# Patient Record
Sex: Male | Born: 1960 | Race: White | Hispanic: No | Marital: Married | Smoking: Former smoker
Health system: Southern US, Community
[De-identification: ages and names within clinical notes are randomized; demographics above are authoritative.]

## PROBLEM LIST (undated history)

## (undated) DIAGNOSIS — S99911A Unspecified injury of right ankle, initial encounter: Secondary | ICD-10-CM

## (undated) DIAGNOSIS — I10 Essential (primary) hypertension: Secondary | ICD-10-CM

## (undated) DIAGNOSIS — K219 Gastro-esophageal reflux disease without esophagitis: Secondary | ICD-10-CM

## (undated) DIAGNOSIS — M5020 Other cervical disc displacement, unspecified cervical region: Secondary | ICD-10-CM

## (undated) DIAGNOSIS — K635 Polyp of colon: Secondary | ICD-10-CM

## (undated) DIAGNOSIS — J302 Other seasonal allergic rhinitis: Secondary | ICD-10-CM

## (undated) DIAGNOSIS — N200 Calculus of kidney: Secondary | ICD-10-CM

## (undated) DIAGNOSIS — M549 Dorsalgia, unspecified: Secondary | ICD-10-CM

## (undated) HISTORY — DX: Dorsalgia, unspecified: M54.9

## (undated) HISTORY — DX: Other cervical disc displacement, unspecified cervical region: M50.20

## (undated) HISTORY — DX: Calculus of kidney: N20.0

## (undated) HISTORY — DX: Gastro-esophageal reflux disease without esophagitis: K21.9

## (undated) HISTORY — PX: CERVICAL FUSION: SHX112

## (undated) HISTORY — DX: Essential (primary) hypertension: I10

---

## 2004-08-07 ENCOUNTER — Ambulatory Visit: Payer: Self-pay | Admitting: Internal Medicine

## 2004-08-20 ENCOUNTER — Ambulatory Visit (HOSPITAL_COMMUNITY): Admission: RE | Admit: 2004-08-20 | Discharge: 2004-08-20 | Payer: Self-pay | Admitting: Internal Medicine

## 2004-08-20 ENCOUNTER — Ambulatory Visit: Payer: Self-pay | Admitting: Internal Medicine

## 2004-08-20 HISTORY — PX: COLONOSCOPY: SHX174

## 2007-01-29 ENCOUNTER — Ambulatory Visit (HOSPITAL_COMMUNITY): Admission: RE | Admit: 2007-01-29 | Discharge: 2007-01-29 | Payer: Self-pay | Admitting: Internal Medicine

## 2007-03-10 ENCOUNTER — Observation Stay (HOSPITAL_COMMUNITY): Admission: RE | Admit: 2007-03-10 | Discharge: 2007-03-11 | Payer: Self-pay | Admitting: Neurosurgery

## 2010-07-23 NOTE — Op Note (Signed)
Marcus Frazier, Marcus Frazier NO.:  [id number]   MEDICAL RECORD NO.:  [id number]          PATIENT TYPE:  INP   LOCATION:  3027                         FACILITY:  [facility]   PHYSICIAN:  Cristi Loron, M.D.DATE OF BIRTH:  04-Sep-1960   DATE OF PROCEDURE:  03/10/2007  DATE OF DISCHARGE:                               OPERATIVE REPORT   BRIEF HISTORY:  The patient is a 50 year old white male who suffered  from neck and right arm pain.  He failed medical management, was worked  up with a cervical MRI which demonstrated a large herniated disc at C5-  C6 on the right, as well as significant spondylosis at C4-C5  bilaterally.  I discussed the various treatment option with the patient  including surgery.  The patient has weighed the risks, benefits, and  alternatives of the surgery and decided to proceed with a C4-C5 and C5-  C6 anterior cervical discectomy fusion and plating.   PREOPERATIVE DIAGNOSES:  C4-C5 and C5-C6 herniated nucleus pulposus,  spondylosis, cervical radiculopathy, myelopathy, and cervicalgia.   POSTOPERATIVE DIAGNOSES:  C4-C5 and C5-C6 herniated nucleus pulposus,  spondylosis, cervical radiculopathy, myelopathy, and cervicalgia.   PROCEDURE:  C4-C5 and C5-C6 extensive anterior cervical  discectomy/decompression; C4-C5 and C5-C6 anterior interbody arthrodesis  with local autograft bone and Actifuse bone graft extender; insertion of  C4-C5 and C5-C6 interbody prosthesis (Alphatec PEEK interbody  prosthesis); C4-C6 anterior cervical plating with Codman Slim-Loc  titanium plate and screws.   SURGEON:  Cristi Loron, MD   ASSISTANT:  Coletta Memos, MD   ANESTHESIA:  General endotracheal.   ESTIMATED BLOOD LOSS:  100 mL.   SPECIMENS:  None.   DRAINS:  None.   COMPLICATIONS:  None.   DESCRIPTION OF PROCEDURE:  The patient was brought to the operating room  by the anesthesia team.  General endotracheal anesthesia was induced.  The patient remained  in supine position.  A roll was placed under the  shoulders to place the neck in a slight extension.  His anterior  cervical region was then prepared with Betadine scrub and Betadine  solution.  Sterile drapes were applied.  I then injected the area to be  incised with Marcaine with epinephrine solution.  I used a scalpel to  make a transverse incision on the patient's left anterior neck.  I used  the Metzenbaum scissors to divide the platysma muscle and then to  dissect medial to the sternocleidomastoid muscle, jugular vein, and  carotid artery.  I carefully dissected down towards the anterior  cervical spine identifying the esophagus and retracted medially.  We  then used Kitner swabs to clear the soft tissue from the anterior  cervical spine and then inserted a bent spinal needle into the upper  exposed intervertebral disc space.  We obtained intraoperative  radiograph to confirm our location.   We then used electrocautery to detach the medial border of the longus  colli muscle bilaterally at C4-C5 and C5-C6 intervertebral disc space.  We inserted a Caspar self-retaining retractor underneath the longus  colli muscle bilaterally to provide exposure.  We began at C5-C6;  we  incised the C5-C6 intervertebral disc and performed a partial  intervertebral discectomy using the pituitary forceps and the Carlen  curettes.  We then inserted distraction screws at C5 and C6 to distract  the interspace.  We then brought the operative microscope into the field  and under its magnification and illumination, we completed the  microdissection/decompression.  We used a high-speed drill to  decorticate the vertebral endplates at C5-C6 to drill away the remainder  of C5-C6 intervertebral disc to drill away some posterior spondylosis  and to thin out the posterior longitudinal ligament.  We then incised  the ligament with arachnoid knife and then removed it with the Kerrison  punch undercutting the  vertebral endplates at C5-C6 and decompressing  the thecal sac and then performed foraminotomy about the bilateral C6  nerve roots.  Of note as expected, we encountered a large herniated disc  at the right C6 nerve root.   Having completed the decompression at C5-C6, we now repeated the  procedure in an analogous fashion at C4-C5; at this level we found  spondylosis and bilateral neural foraminal stenosis.  We decompressed  the thecal sac and bilateral C5 nerve roots.   Having completed the decompression at C4-C5 and C5-C6, we now turned our  attention to arthrodesis.  We used trial spacers and determined to use a  5-mm medium PEEK interbody prosthesis.  We prefilled this prosthesis  with Actifuse bone graft substitute as well as local autograft bone we  obtained during the decompression and then inserted the prosthesis at  the distracted C5-C6 and C4-C5 interspaces.  We removed the distraction  screws, and the prosthesis had a good snug fit at both levels.   Having completed the insertion of the prosthesis and interbody fusion,  we now turned attention to anterior spinal instrumentation.  We used a  high-speed drill to remove some ventral spondylosis from the vertebral  endplates at C4-C5 and C5-C6 so that the plate would lay down flat.  We  selected the appropriate length of Codman Slim-Loc anterior cervical  plate and laid it along the anterior aspect of the vertebral bodies from  C4 to C6.  We then drilled two 14-mm holes at C4, C5, and C6 and then  secured the plate to the vertebral bodies by placing two 14-mm self-  tapping screws at C4, C5, and C6.  We obtained intraoperative  radiograph; it demonstrated good position of the plate, screws, and  bioprosthesis.  We therefore secured the screws and plate by locking  each cam.   We then obtained hemostasis using bipolar electrocautery.  We irrigated  the wound out with bacitracin solution.  We then removed the retractor  and  inspected the esophagus for any damage; there was none apparent.  We  then reapproximated the patient's platysma muscle with interrupted 3-0  Vicryl suture, the subcutaneous tissue with interrupted 3-0 Vicryl  suture, and the skin with Steri-Strips and Benzoin.  The wound was then  coated with bacitracin ointment and a sterile dressing was applied.  The  drapes were removed.  The patient was subsequently extubated by the  anesthesia team and transported to the post-anesthesia care unit in a  stable condition.  All sponge, instrument, and needle counts were  correct at the end of this case.      Cristi Loron, M.D.  Electronically Signed     JDJ/MEDQ  D:  03/10/2007  T:  03/11/2007  Job:  [id number]

## 2010-07-26 NOTE — Consult Note (Signed)
NAME:  [name], [name]               ACCOUNT NO.:  [id number]   MEDICAL RECORD NO.:  [id number]         PATIENT TYPE:  AMB   LOCATION:                                FACILITY:  [facility]   PHYSICIAN:  R. Roetta Sessions, M.D. DATE OF BIRTH:  09/05/1960   DATE OF CONSULTATION:  DATE OF DISCHARGE:                                   CONSULTATION   REASON FOR CONSULTATION:  Episode of left upper quadrant abdominal pain.   HISTORY OF PRESENT ILLNESS:  Mr. Marcus Frazier is a pleasant 50 year old  Caucasian man who comes from Dr. Yetta Numbers to further evaluate a recent  several _____________ history of left lower quadrant abdominal pain.  Symptoms started insidiously, not associated with bowel movement, no  associated constipation, diarrhea, hematochezia, or melena.  No fever,  chills, no upper GI tract symptoms/odynophagia, reflux symptoms, nausea,  vomiting, no change in weight.  [facility] scan performed at [facility] demonstrated no abnormalities of the abdomen or pelvis.  He was  given some Cipro empirically.  He told me he took one dose of Cipro and his  symptoms resolved.  He has not had any hematuria or increased urinary  frequency, no flank pain.  He has never had similar symptoms previously.  There is no family history of colorectal neoplasia.  He does note  intermittent anorectal burning and itching from time to time, and he has  seen some blood on the toilet paper on wiping.   PAST MEDICAL HISTORY:  Hypertension.   PAST SURGICAL HISTORY:  He denies any prior surgeries.   CURRENT MEDICATIONS:  1.  Diovan/HCTZ 160/12.5 mg daily.  2.  Tylenol.  3.  Actos p.r.n.   ALLERGIES:  No known drug allergies.   FAMILY HISTORY:  Father died in an accident.  Mother is alive at age 77.  No  history of chronic GI or liver illness.   SOCIAL HISTORY:  The patient is married, has two girls, age 35 and 42.  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  No  tobacco.  Rarely consumes alcohol.   REVIEW OF SYSTEMS:  As in history of present illness.   PHYSICAL EXAMINATION:  GENERAL:  A pleasant 50 year old gentleman resting  comfortably.  VITAL SIGNS:  Weight 233, height 5 feet 10 inches, temperature 97.8, blood  pressure 118/70, pulse 62.  SKIN:  Warm and dry.  HEENT:  No scleral icterus.  NECK:  JVD is not prominent.  LUNGS:  Clear to auscultation.  CARDIAC:  Regular rate and rhythm without murmurs, rubs, or gallops.  ABDOMEN:  Nondistended, positive bowel sounds, soft, nontender without  appreciable mass or organomegaly.  EXTREMITIES:  No edema.  RECTAL:  Deferred to colonoscopy.   IMPRESSION:  Mr. Marcus Frazier is a pleasant 50 year old gentleman with a  recent bout of left lower quadrant abdominal pain which has now apparently  resolved.  CT scan was unrevealing.  Of note, there was no evidence of  diverticulosis, much less diverticulitis.  He has intermittent paper  hematochezia as well.   At this point, the left lower  quadrant abdominal pain has not been  explained.  I think it would be a good idea to go ahead and image his lower  GI tract just to make sure there is not a significant occult lesion  contributing to his symptoms.  To this end, I have offered Mr. Newland a  colonoscopy.  We discussed at length the potential risks, benefits, and  alternatives.  His questions were answered.  He is agreeable.  We will make  further recommendations after colonoscopy has been performed.   I would like to thank Dr. Yetta Numbers for his continued confidence in me.       RMR/MEDQ  D:  08/07/2004  T:  08/07/2004  Job:  [id number]   cc:   Kirk Ruths, M.D.  [id number]  [place]  [place] [address]  Fax: [phone]

## 2010-07-26 NOTE — Op Note (Signed)
NAME:  Marcus Frazier, Marcus Frazier               ACCOUNT NO.:  [id number]   MEDICAL RECORD NO.:  [id number]          PATIENT TYPE:  AMB   LOCATION:  DAY                           FACILITY:  [facility]   PHYSICIAN:  R. Roetta Sessions, M.D. DATE OF BIRTH:  [birth year]   DATE OF PROCEDURE:  08/20/2004  DATE OF DISCHARGE:                                 OPERATIVE REPORT   PROCEDURE PERFORMED:  Colonoscopy with biopsy.  Ileoscopy with biopsy.   INDICATIONS FOR PROCEDURE:  The patient is a 50 year old gentleman with  recent history of left lower quadrant abdominal pain which has improved  significantly on its own.  CT demonstrated no abnormalities.  He does have  intermittent very low volume type hematochezia.  Colonoscopy is now being  done.  This approach has been discussed with the patient at length.  Potential risks, benefits and alternatives have been reviewed, questions  have been answered.  It is notable he was treated with a course of Cipro  previously.   PROCEDURE NOTE:  Oxygen saturations, blood pressure, pulse and respirations  were monitored throughout the entire procedure.   FINDINGS:  Digital exam revealed no abnormalities.   ENDOSCOPIC FINDINGS:  Prep was good.   Rectum:  Examination of the rectal mucosa including retroflex view of the  anal verge revealed only a diminutive 3 mm polyp at 4 cm from the anal  verge.  Colon:  Colonic mucosa was surveyed from the rectosigmoid junction to the  left, transverse and right colon to the area of the appendiceal orifice and  ileocecal valve and cecum.  These structures were well seen and photographed  for the record.  The terminal ileum was intubated 10 cm.  From this level,  the scope was slowly withdrawn.  All previously mentioned mucosal surfaces  were again seen.  The patient had a 3 mm polyp at the base of the cecum  which was cold biopsied/removed.  There was a second diminutive polyp in the  rectum which was cold biopsied and removed.  The  patient had very minimal,  scattered shallow mouthed left-sided diverticula.  The terminal ileum again  was intubated 10 cm.  The terminal ileum mucosa appeared somewhat swollen  and edematous but no erosion, ulceration or stricture was seen.  I did  elective biopsy.  The remainder of colonic mucosa appeared normal.  The  patient tolerated the procedure well, was reacted in endoscopy.   IMPRESSION:  Diminutive rectal and cecal polyps removed with cold biopsy  forcep.  Minimal left-sided diverticula.  Remainder of colonic mucosa  appeared normal, somewhat edematous/swollen appearing terminal ileum mucosa  of uncertain clinical significance, biopsied.   RECOMMENDATIONS:  1.  Follow-up on pathology.  2.  Patient should fortify his daily fiber and ideally take a fiber      supplement in the way of Benefiber or Citrucel would be a good idea.   Further recommendations pending pathology.       RMR/MEDQ  D:  08/20/2004  T:  08/20/2004  Job:  [id number]   cc:   Kirk Ruths, M.D.  [id number]  [place]  [place] [address]  Fax: [phone]

## 2010-12-13 LAB — CBC
Hemoglobin: 16.2
MCHC: 33.5
MCV: 90.9
RBC: 5.31

## 2010-12-13 LAB — TYPE AND SCREEN
ABO/RH(D): O POS
Antibody Screen: NEGATIVE

## 2010-12-13 LAB — DIFFERENTIAL
Basophils Absolute: 0
Basophils Relative: 1
Eosinophils Absolute: 0.2
Eosinophils Relative: 2
Lymphocytes Relative: 24
Lymphs Abs: 2.4
Monocytes Absolute: 0.8
Monocytes Relative: 8

## 2010-12-13 LAB — BASIC METABOLIC PANEL
Chloride: 104
GFR calc Af Amer: >60
GFR calc non Af Amer: >60

## 2011-04-22 ENCOUNTER — Ambulatory Visit (INDEPENDENT_AMBULATORY_CARE_PROVIDER_SITE_OTHER): Payer: BC Managed Care – PPO | Admitting: Internal Medicine

## 2011-04-22 DIAGNOSIS — K635 Polyp of colon: Secondary | ICD-10-CM | POA: Insufficient documentation

## 2011-04-22 DIAGNOSIS — D126 Benign neoplasm of colon, unspecified: Secondary | ICD-10-CM

## 2011-04-22 MED ORDER — PEG-KCL-NACL-NASULF-NA ASC-C 100 G PO SOLR: 1.0000 | Freq: Once | ORAL | Status: DC

## 2011-04-22 NOTE — Progress Notes (Signed)
Addended by: Cherene Julian D on: 04/22/2011 02:45 PM   Modules accepted: Orders

## 2011-04-22 NOTE — Progress Notes (Signed)
Primary Care Physician:  FUSCO,LAWRENCE J., MD, MD Primary Gastroenterologist:  Dr. Micahel Omlor  Pre-Procedure History & Physical: HPI:  Marcus Frazier is a 51 y.o. male here for for followup colonoscopy. Patient underwent a colonoscopy in 2006 by me. A small polyp was removed he had early diverticular changes left colon. He was told to return in 5 years for followup. He's put it off until now. He has done well aside from having cervical fusion surgery since I last saw him. He had a bout of low back pain recently felt to be a strain-it has since resolved. No rectal bleeding melena constipation diarrhea denies abdominal pain currently. No upper GI tract symptoms. No family history of polyps or colon cancer.  Past Medical History  Diagnosis Date  . Diverticula of colon   . HTN (hypertension)   . Back pain   . Herniated disc, cervical     Past Surgical History  Procedure Date  . Colonoscopy 08/20/2004    diverticula  . Cervical fusion     C4-C6    Prior to Admission medications   Medication Sig Start Date End Date Taking? Authorizing Provider  DIOVAN HCT 160-12.5 MG per tablet Take 1 tablet by mouth daily.  04/16/11  Yes Historical Provider, MD  loratadine (CLARITIN) 10 MG tablet Take 10 mg by mouth daily.   Yes Historical Provider, MD  nabumetone (RELAFEN) 500 MG tablet Take 500 mg by mouth 2 (two) times daily.  04/16/11  Yes Historical Provider, MD    Allergies as of 04/22/2011  . (No Known Allergies)    No family history on file.  History   Social History  . Marital Status: Married    Spouse Name: N/A    Number of Children: N/A  . Years of Education: N/A   Occupational History  . Not on file.   Social History Main Topics  . Smoking status: Never Smoker   . Smokeless tobacco: Not on file  . Alcohol Use: No  . Drug Use: No  . Sexually Active: Not on file   Other Topics Concern  . Not on file   Social History Narrative  . No narrative on file    Review of Systems: See  HPI, otherwise negative ROS  Physical Exam: BP 134/82  Pulse 84  Temp(Src) 98.2 F (36.8 C) (Temporal)  Ht 5' 9" (1.753 m)  Wt 245 lb (111.131 kg)  BMI 36.18 kg/m2 General:   Alert,  Well-developed, well-nourished, pleasant and cooperative in NAD Skin:  Intact without significant lesions or rashes. Eyes:  Sclera clear, no icterus.   Conjunctiva pink. Ears:  Normal auditory acuity. Nose:  No deformity, discharge,  or lesions. Mouth:  No deformity or lesions. Neck:  Supple; no masses or thyromegaly. No significant cervical adenopathy. Lungs:  Clear throughout to auscultation.   No wheezes, crackles, or rhonchi. No acute distress. Heart:  Regular rate and rhythm; no murmurs, clicks, rubs,  or gallops. Abdomen: Non-distended, normal bowel sounds.  Soft and nontender without appreciable mass or hepatosplenomegaly.  Pulses:  Normal pulses noted. Extremities:  Without clubbing or edema.  Impression/Plan:  

## 2011-04-22 NOTE — Patient Instructions (Signed)
Colonoscopy in the near future - follow-up on polyp previously removed

## 2011-05-02 MED ORDER — GLYCOPYRROLATE 0.2 MG/ML IJ SOLN
INTRAMUSCULAR | Status: AC
  Filled 2011-05-02: qty 1

## 2011-05-02 MED ORDER — PROMETHAZINE HCL 25 MG/ML IJ SOLN
INTRAMUSCULAR | Status: AC
  Filled 2011-05-02: qty 1

## 2011-05-07 ENCOUNTER — Encounter (HOSPITAL_COMMUNITY): Payer: Self-pay | Admitting: Pharmacy Technician

## 2011-05-09 ENCOUNTER — Telehealth: Payer: Self-pay | Admitting: Gastroenterology

## 2011-05-09 MED ORDER — SODIUM CHLORIDE 0.45 % IV SOLN
Freq: Once | INTRAVENOUS | Status: AC
  Administered 2011-05-12: 08:00:00 via INTRAVENOUS

## 2011-05-09 NOTE — Telephone Encounter (Signed)
Spoke with wife, informed her that pts procedure time has been changed- they will need to arrive at 7:30 for a 8:40 procedure - she voiced her understanding

## 2011-05-12 ENCOUNTER — Encounter (HOSPITAL_COMMUNITY)
Admission: RE | Disposition: A | Discharge status: Home / Self Care | Payer: Self-pay | Source: Ambulatory Visit | Attending: Internal Medicine

## 2011-05-12 ENCOUNTER — Ambulatory Visit (HOSPITAL_COMMUNITY)
Admission: RE | Admit: 2011-05-12 | Discharge: 2011-05-12 | Disposition: A | Discharge status: Home / Self Care | Payer: BC Managed Care – PPO | Source: Ambulatory Visit | Attending: Internal Medicine | Admitting: Internal Medicine

## 2011-05-12 ENCOUNTER — Encounter (HOSPITAL_COMMUNITY): Payer: Self-pay | Admitting: *Deleted

## 2011-05-12 DIAGNOSIS — I10 Essential (primary) hypertension: Secondary | ICD-10-CM | POA: Insufficient documentation

## 2011-05-12 DIAGNOSIS — Z860100 Personal history of colon polyps, unspecified: Secondary | ICD-10-CM | POA: Insufficient documentation

## 2011-05-12 DIAGNOSIS — Z79899 Other long term (current) drug therapy: Secondary | ICD-10-CM | POA: Insufficient documentation

## 2011-05-12 DIAGNOSIS — Z1211 Encounter for screening for malignant neoplasm of colon: Secondary | ICD-10-CM

## 2011-05-12 DIAGNOSIS — K5730 Diverticulosis of large intestine without perforation or abscess without bleeding: Secondary | ICD-10-CM

## 2011-05-12 DIAGNOSIS — K635 Polyp of colon: Secondary | ICD-10-CM

## 2011-05-12 HISTORY — DX: Other seasonal allergic rhinitis: J30.2

## 2011-05-12 HISTORY — DX: Polyp of colon: K63.5

## 2011-05-12 HISTORY — PX: COLONOSCOPY: SHX5424

## 2011-05-12 SURGERY — COLONOSCOPY
Anesthesia: Moderate Sedation

## 2011-05-12 MED ORDER — MIDAZOLAM HCL 5 MG/5ML IJ SOLN
INTRAMUSCULAR | Status: DC | PRN
  Administered 2011-05-12: 2 mg via INTRAVENOUS
  Administered 2011-05-12: 1 mg via INTRAVENOUS

## 2011-05-12 MED ORDER — MEPERIDINE HCL 100 MG/ML IJ SOLN
INTRAMUSCULAR | Status: DC | PRN
  Administered 2011-05-12: 50 mg via INTRAVENOUS
  Administered 2011-05-12: 25 mg via INTRAVENOUS

## 2011-05-12 MED ORDER — STERILE WATER FOR IRRIGATION IR SOLN
Status: DC | PRN
  Administered 2011-05-12: 09:00:00

## 2011-05-12 MED ORDER — MIDAZOLAM HCL 5 MG/5ML IJ SOLN
INTRAMUSCULAR | Status: AC
  Filled 2011-05-12: qty 10

## 2011-05-12 MED ORDER — MEPERIDINE HCL 100 MG/ML IJ SOLN
INTRAMUSCULAR | Status: AC
  Filled 2011-05-12: qty 2

## 2011-05-12 NOTE — H&P (View-Only) (Signed)
Primary Care Physician:  Cassell Smiles., MD, MD Primary Gastroenterologist:  Dr. Jena Gauss  Pre-Procedure History & Physical: HPI:  Marcus Frazier is a 51 y.o. male here for for followup colonoscopy. Patient underwent a colonoscopy in 2006 by me. A small polyp was removed he had early diverticular changes left colon. He was told to return in 5 years for followup. He's put it off until now. He has done well aside from having cervical fusion surgery since I last saw him. He had a bout of low back pain recently felt to be a strain-it has since resolved. No rectal bleeding melena constipation diarrhea denies abdominal pain currently. No upper GI tract symptoms. No family history of polyps or colon cancer.  Past Medical History  Diagnosis Date  . Diverticula of colon   . HTN (hypertension)   . Back pain   . Herniated disc, cervical     Past Surgical History  Procedure Date  . Colonoscopy 08/20/2004    diverticula  . Cervical fusion     C4-C6    Prior to Admission medications   Medication Sig Start Date End Date Taking? Authorizing Provider  DIOVAN HCT 160-12.5 MG per tablet Take 1 tablet by mouth daily.  04/16/11  Yes Historical Provider, MD  loratadine (CLARITIN) 10 MG tablet Take 10 mg by mouth daily.   Yes Historical Provider, MD  nabumetone (RELAFEN) 500 MG tablet Take 500 mg by mouth 2 (two) times daily.  04/16/11  Yes Historical Provider, MD    Allergies as of 04/22/2011  . (No Known Allergies)    No family history on file.  History   Social History  . Marital Status: Married    Spouse Name: N/A    Number of Children: N/A  . Years of Education: N/A   Occupational History  . Not on file.   Social History Main Topics  . Smoking status: Never Smoker   . Smokeless tobacco: Not on file  . Alcohol Use: No  . Drug Use: No  . Sexually Active: Not on file   Other Topics Concern  . Not on file   Social History Narrative  . No narrative on file    Review of Systems: See  HPI, otherwise negative ROS  Physical Exam: BP 134/82  Pulse 84  Temp(Src) 98.2 F (36.8 C) (Temporal)  Ht 5\' 9"  (1.753 m)  Wt 245 lb (111.131 kg)  BMI 36.18 kg/m2 General:   Alert,  Well-developed, well-nourished, pleasant and cooperative in NAD Skin:  Intact without significant lesions or rashes. Eyes:  Sclera clear, no icterus.   Conjunctiva pink. Ears:  Normal auditory acuity. Nose:  No deformity, discharge,  or lesions. Mouth:  No deformity or lesions. Neck:  Supple; no masses or thyromegaly. No significant cervical adenopathy. Lungs:  Clear throughout to auscultation.   No wheezes, crackles, or rhonchi. No acute distress. Heart:  Regular rate and rhythm; no murmurs, clicks, rubs,  or gallops. Abdomen: Non-distended, normal bowel sounds.  Soft and nontender without appreciable mass or hepatosplenomegaly.  Pulses:  Normal pulses noted. Extremities:  Without clubbing or edema.  Impression/Plan:

## 2011-05-12 NOTE — Op Note (Signed)
[facility] [address] [place], [place]  [address]  COLONOSCOPY PROCEDURE REPORT  PATIENT:  Marcus Frazier, Marcus Frazier  MR#:  [id number] BIRTHDATE:  Feb 16, 1961, 51 yrs. old  GENDER:  male ENDOSCOPIST:  R. Roetta Sessions, MD FACP [facility] REF. BY:  Artis Delay, M.D. PROCEDURE DATE:  05/12/2011 PROCEDURE:  Surveillance colonoscopy  INDICATIONS:  History of colonic polyp  INFORMED CONSENT:  The risks, benefits, alternatives and imponderables including but not limited to bleeding, perforation as well as the possibility of a missed lesion have been reviewed. The potential for biopsy, lesion removal, etc. have also been discussed.  Questions have been answered.  All parties agreeable. Please see the history and physical in the medical record for more information.  MEDICATIONS:  Versed 3 mg IV and Demerol 75 mg IV in divided doses.  DESCRIPTION OF PROCEDURE:  After a digital rectal exam was performed, the EC-[id number] ([id number]) colonoscope was advanced from the anus through the rectum and colon to the area of the cecum, ileocecal valve and appendiceal orifice.  The cecum was deeply intubated.  These structures were well-seen and photographed for the record.  From the level of the cecum and ileocecal valve, the scope was slowly and cautiously withdrawn.  The mucosal surfaces were carefully surveyed utilizing scope tip deflection to facilitate fold flattening as needed.  The scope was pulled down into the rectum where a thorough examination including retroflexion was performed. <<PROCEDUREIMAGES>>  FINDINGS: Excellent preparation. Normal rectum. Sigmoid diverticulosis; remainder of colonic mucosa appeared normal  THERAPEUTIC / DIAGNOSTIC MANEUVERS PERFORMED: None  COMPLICATIONS:  None  CECAL WITHDRAWAL TIME: 7 minutes  IMPRESSION: Colonic diverticulosis  RECOMMENDATIONS: Repeat surveillance colonoscopy in 5 years  ______________________________ R. Roetta Sessions, MD Caleen Essex  CC:  Artis Delay, M.D.  n. eSIGNED:   R. Roetta Sessions at 05/12/2011 09:22 AM  Roberts Gaudy, [id number]

## 2011-05-12 NOTE — Interval H&P Note (Signed)
History and Physical Interval Note:  05/12/2011 8:49 AM  Delrae Rend  has presented today for surgery, with the diagnosis of hx of polyps  The various methods of treatment have been discussed with the patient and family. After consideration of risks, benefits and other options for treatment, the patient has consented to  Procedure(s) (LRB): COLONOSCOPY (N/A) as a surgical intervention .  The patients' history has been reviewed, patient examined, no change in status, stable for surgery.  I have reviewed the patients' chart and labs.  Questions were answered to the patient's satisfaction.     Eula Listen

## 2011-05-12 NOTE — Discharge Instructions (Signed)
Diverticulosis Diverticulosis is a common condition that develops when small pouches (diverticula) form in the wall of the colon. The risk of diverticulosis increases with age. It happens more often in people who eat a low-fiber diet. Most individuals with diverticulosis have no symptoms. Those individuals with symptoms usually experience abdominal pain, constipation, or loose stools (diarrhea). HOME CARE INSTRUCTIONS   Increase the amount of fiber in your diet as directed by your caregiver or dietician. This may reduce symptoms of diverticulosis.   Your caregiver may recommend taking a dietary fiber supplement.   Drink at least 6 to 8 glasses of water each day to prevent constipation.   Try not to strain when you have a bowel movement.   Your caregiver may recommend avoiding nuts and seeds to prevent complications, although this is still an uncertain benefit.   Only take over-the-counter or prescription medicines for pain, discomfort, or fever as directed by your caregiver.  FOODS WITH HIGH FIBER CONTENT INCLUDE:  Fruits. Apple, peach, pear, tangerine, raisins, prunes.   Vegetables. Brussels sprouts, asparagus, broccoli, cabbage, carrot, cauliflower, romaine lettuce, spinach, summer squash, tomato, winter squash, zucchini.   Starchy Vegetables. Baked beans, kidney beans, lima beans, split peas, lentils, potatoes (with skin).   Grains. Whole wheat bread, brown rice, bran flake cereal, plain oatmeal, white rice, shredded wheat, bran muffins.  SEEK IMMEDIATE MEDICAL CARE IF:   You develop increasing pain or severe bloating.   You have an oral temperature above 102 F (38.9 C), not controlled by medicine.   You develop vomiting or bowel movements that are bloody or black.  Document Released: 11/22/2003 Document Revised: 02/13/2011 Document Reviewed: 07/25/2009 ExitCare Patient Information 2012 Exit[employer], [employer] Care After Read the instructions outlined below and refer to this  sheet in the next few weeks. These discharge instructions provide you with general information on caring for yourself after you leave the hospital. Your doctor may also give you specific instructions. While your treatment has been planned according to the most current medical practices available, unavoidable complications occasionally occur. If you have any problems or questions after discharge, call your doctor. HOME CARE INSTRUCTIONS ACTIVITY:  You may resume your regular activity, but move at a slower pace for the next 24 hours.   Take frequent rest periods for the next 24 hours.   Walking will help get rid of the air and reduce the bloated feeling in your belly (abdomen).   No driving for 24 hours (because of the medicine (anesthesia) used during the test).   You may shower.   Do not sign any important legal documents or operate any machinery for 24 hours (because of the anesthesia used during the test).  NUTRITION:  Drink plenty of fluids.   You may resume your normal diet as instructed by your doctor.   Begin with a light meal and progress to your normal diet. Heavy or fried foods are harder to digest and may make you feel sick to your stomach (nauseated).   Avoid alcoholic beverages for 24 hours or as instructed.  MEDICATIONS:  You may resume your normal medications unless your doctor tells you otherwise.  WHAT TO EXPECT TODAY:  Some feelings of bloating in the abdomen.   Passage of more gas than usual.   Spotting of blood in your stool or on the toilet paper.  IF YOU HAD POLYPS REMOVED DURING THE COLONOSCOPY:  No aspirin products for 7 days or as instructed.   No alcohol for 7 days or as instructed.     Eat a soft diet for the next 24 hours.  FINDING OUT THE RESULTS OF YOUR TEST Not all test results are available during your visit. If your test results are not back during the visit, make an appointment with your caregiver to find out the results. Do not assume  everything is normal if you have not heard from your caregiver or the medical facility. It is important for you to follow up on all of your test results.  SEEK IMMEDIATE MEDICAL CARE IF:  You have more than a spotting of blood in your stool.   Your belly is swollen (abdominal distention).   You are nauseated or vomiting.   You have a fever.   You have abdominal pain or discomfort that is severe or gets worse throughout the day.  Document Released: 10/09/2003 Document Revised: 02/13/2011 Document Reviewed: 10/07/2007 ExitCare Patient Information 2012 [employer] 

## 2011-05-16 ENCOUNTER — Encounter (HOSPITAL_COMMUNITY): Payer: Self-pay | Admitting: Internal Medicine

## 2011-06-24 ENCOUNTER — Other Ambulatory Visit (HOSPITAL_COMMUNITY): Payer: Self-pay | Admitting: Orthopaedic Surgery

## 2011-06-24 DIAGNOSIS — M25561 Pain in right knee: Secondary | ICD-10-CM

## 2011-06-25 ENCOUNTER — Ambulatory Visit (HOSPITAL_COMMUNITY)
Admission: RE | Admit: 2011-06-25 | Discharge: 2011-06-25 | Disposition: A | Discharge status: Home / Self Care | Payer: BC Managed Care – PPO | Source: Ambulatory Visit | Attending: Orthopaedic Surgery | Admitting: Orthopaedic Surgery

## 2011-06-25 DIAGNOSIS — M25569 Pain in unspecified knee: Secondary | ICD-10-CM | POA: Insufficient documentation

## 2011-06-25 DIAGNOSIS — M25561 Pain in right knee: Secondary | ICD-10-CM

## 2011-06-25 DIAGNOSIS — X58XXXA Exposure to other specified factors, initial encounter: Secondary | ICD-10-CM | POA: Insufficient documentation

## 2011-06-25 DIAGNOSIS — M25669 Stiffness of unspecified knee, not elsewhere classified: Secondary | ICD-10-CM | POA: Insufficient documentation

## 2011-06-25 DIAGNOSIS — M25469 Effusion, unspecified knee: Secondary | ICD-10-CM | POA: Insufficient documentation

## 2011-06-25 DIAGNOSIS — M23302 Other meniscus derangements, unspecified lateral meniscus, unspecified knee: Secondary | ICD-10-CM | POA: Insufficient documentation

## 2011-06-25 DIAGNOSIS — IMO0002 Reserved for concepts with insufficient information to code with codable children: Secondary | ICD-10-CM | POA: Insufficient documentation

## 2013-01-18 ENCOUNTER — Encounter: Payer: Self-pay | Admitting: Internal Medicine

## 2013-01-18 ENCOUNTER — Ambulatory Visit (INDEPENDENT_AMBULATORY_CARE_PROVIDER_SITE_OTHER): Payer: BC Managed Care – PPO | Admitting: Internal Medicine

## 2013-01-18 ENCOUNTER — Encounter (INDEPENDENT_AMBULATORY_CARE_PROVIDER_SITE_OTHER): Payer: Self-pay

## 2013-01-18 VITALS — BP 141/81 | HR 82 | Temp 97.1°F | Wt 242.8 lb

## 2013-01-18 DIAGNOSIS — K5900 Constipation, unspecified: Secondary | ICD-10-CM | POA: Insufficient documentation

## 2013-01-18 DIAGNOSIS — K6289 Other specified diseases of anus and rectum: Secondary | ICD-10-CM | POA: Insufficient documentation

## 2013-01-18 DIAGNOSIS — K625 Hemorrhage of anus and rectum: Secondary | ICD-10-CM | POA: Insufficient documentation

## 2013-01-18 NOTE — Progress Notes (Signed)
Primary Care Physician:  Cassell Smiles., MD Primary Gastroenterologist:  Dr. Jena Gauss  Pre-Procedure History & Physical: HPI:  Marcus Frazier is a 52 y.o. male here for 6 month history of anal rectal pain. Patient states started after he strained hard bowel movement one day. Has intermittently severe burning pain that starts couple hours after he has a bowel movement and may last for a couple of hours and subsides. Typically constipated having a bowel movement every other day. Occasionally has a small amount of bright red blood (.. No associated abdominal pain. Does not have anything come out of his rectum although he said he has noticed some prolapse of hemorrhoids in years past. Colonoscopy your half ago demonstrated only diverticulosis; he is due for surveillance examination-history of polyps in about 3-1/2 years. Past Medical History  Diagnosis Date  . Diverticula of colon   . HTN (hypertension)   . Back pain   . Herniated disc, cervical   . Seasonal allergies   . Colon polyps     Past Surgical History  Procedure Laterality Date  . Colonoscopy  08/20/2004    diverticula  . Cervical fusion      C4-C6  . Colonoscopy  05/12/2011    Tanelle Lanzo- normal rectum, sigmoid diverticulosis, remainder of colonic mucosa appeared normal.    Prior to Admission medications   Medication Sig Start Date End Date Taking? Authorizing Provider  DIOVAN HCT 160-12.5 MG per tablet Take 1 tablet by mouth daily.  04/16/11  Yes Historical Provider, MD  loratadine (CLARITIN) 10 MG tablet Take 10 mg by mouth daily.    Historical Provider, MD  peg 3350 powder (MOVIPREP) 100 G SOLR Take 1 kit (100 g total) by mouth once. As directed Please purchase 1 Fleets enema to use with the prep 04/22/11   Corbin Ade, MD    Allergies as of 01/18/2013  . (No Known Allergies)    Family History  Problem Relation Age of Onset  . Colon cancer Neg Hx     History   Social History  . Marital Status: Married    Spouse Name:  N/A    Number of Children: N/A  . Years of Education: N/A   Occupational History  . Not on file.   Social History Main Topics  . Smoking status: Former Smoker -- 1.00 packs/day for 4 years    Types: Cigarettes  . Smokeless tobacco: Not on file  . Alcohol Use: No  . Drug Use: No  . Sexual Activity: Not on file   Other Topics Concern  . Not on file   Social History Narrative  . No narrative on file    Review of Systems: See HPI, otherwise negative ROS  Physical Exam: BP 141/81  Pulse 82  Temp(Src) 97.1 F (36.2 C) (Oral)  Wt 242 lb 12.8 oz (110.133 kg) General:   Alert,  Well-developed, well-nourished, pleasant and cooperative in NAD Skin:  Intact without significant lesions or rashes. Eyes:  Sclera clear, no icterus.   Conjunctiva pink. Ears:  Normal auditory acuity. Nose:  No deformity, discharge,  or lesions. Mouth:  No deformity or lesions. Neck:  Supple; no masses or thyromegaly. No significant cervical adenopathy. Lungs:  Clear throughout to auscultation.   No wheezes, crackles, or rhonchi. No acute distress. Heart:  Regular rate and rhythm; no murmurs, clicks, rubs,  or gallops. Abdomen: Non-distended, normal bowel sounds.  Soft and nontender without appreciable mass or hepatosplenomegaly.  Pulses:  Normal pulses noted. Extremities:  Without  clubbing or edema. Rectal exam: No external lesions. He has a tight anal canal digital exam. His localize pain on left side. No prolapsed hemorrhoids noted. No mass noted. Scant brown stools Hemoccult negative  Impression:   52 year old gentleman with rectal pain temporally associated with constipation;  I suspect he has developed an anal fissure. He does give a history of grade 2 hemorrhoids in the past but hemorrhoids do not appear to be an issue at this time. It is good to know he had a colonoscopy about your half ago. Scenario not consistent with levator ani syndrome or proctalgia fugax. No evidence of perirectal abscess on  today's examination.  Intermittent rectal bleeding likely due to an anal fissure.   Recommendations:  Avoid straining (limit toilet time to 5 minutes)  Benefiber 2 teaspoons twice daily  Miralax - one capful at bedtime on any day without a bowel movement  NTG .125% ointment - apply pea-sized amount to anorectum up to 4 times daily  Office visit here in 6 weeks  Information on constipation and anal fissure provided

## 2013-01-18 NOTE — Patient Instructions (Addendum)
Avoid straining (limit toilet time to 5 minutes)  Benefiber 2 teaspoons twice daily  Miralax - one capful at bedtime on any day without a bowel movement  NTG .125% ointment - apply pea-sized amount to anorectum up to 4 times daily  Office visit here in 6 weeks  Information on constipation and anal fissure provided

## 2013-01-18 NOTE — Progress Notes (Signed)
Prescription of the Nitroglycerine ointment 0.125 % , 30 gm, apply a pea sized amount internally two to three times daily and one refill per Dr. Jena Gauss. Called to [place] at [facility].

## 2013-02-25 ENCOUNTER — Encounter: Payer: Self-pay | Admitting: Internal Medicine

## 2013-02-25 ENCOUNTER — Ambulatory Visit (INDEPENDENT_AMBULATORY_CARE_PROVIDER_SITE_OTHER): Payer: BC Managed Care – PPO | Admitting: Internal Medicine

## 2013-02-25 ENCOUNTER — Encounter (INDEPENDENT_AMBULATORY_CARE_PROVIDER_SITE_OTHER): Payer: Self-pay

## 2013-02-25 VITALS — BP 150/84 | HR 79 | Temp 97.4°F | Wt 244.8 lb

## 2013-02-25 DIAGNOSIS — K602 Anal fissure, unspecified: Secondary | ICD-10-CM

## 2013-02-25 DIAGNOSIS — K648 Other hemorrhoids: Secondary | ICD-10-CM

## 2013-02-25 NOTE — Patient Instructions (Signed)
Benefiber 2 teaspoons twice daily -indefinintly  Continue to use NTG ointment  OV here in 4 months

## 2013-02-25 NOTE — Progress Notes (Signed)
Primary Care Physician:  Cassell Smiles., MD Primary Gastroenterologist:  Dr. Jena Gauss  Pre-Procedure History & Physical: HPI:  Marcus Frazier is a 52 y.o. male here for followup of probable anal fissure. Pruritus, proctalgia and bleeding all improved. Hasn't been taking Benefiber regularly. States bowel function seemed to improve spontaneously although he does not necessarily have a bowel movement every day. He rates overall improvement about 50%.  Past Medical History  Diagnosis Date  . Diverticula of colon   . HTN (hypertension)   . Back pain   . Herniated disc, cervical   . Seasonal allergies   . Colon polyps     Past Surgical History  Procedure Laterality Date  . Colonoscopy  08/20/2004    diverticula  . Cervical fusion      C4-C6  . Colonoscopy  05/12/2011    Jacques Willingham- normal rectum, sigmoid diverticulosis, remainder of colonic mucosa appeared normal.    Prior to Admission medications   Medication Sig Start Date End Date Taking? Authorizing Provider  DIOVAN HCT 160-12.5 MG per tablet Take 1 tablet by mouth daily.  04/16/11  Yes Historical Provider, MD  loratadine (CLARITIN) 10 MG tablet Take 10 mg by mouth daily.   Yes Historical Provider, MD  peg 3350 powder (MOVIPREP) 100 G SOLR Take 1 kit (100 g total) by mouth once. As directed Please purchase 1 Fleets enema to use with the prep 04/22/11   Corbin Ade, MD    Allergies as of 02/25/2013  . (No Known Allergies)    Family History  Problem Relation Age of Onset  . Colon cancer Neg Hx     History   Social History  . Marital Status: Married    Spouse Name: N/A    Number of Children: N/A  . Years of Education: N/A   Occupational History  . Not on file.   Social History Main Topics  . Smoking status: Former Smoker -- 1.00 packs/day for 4 years    Types: Cigarettes  . Smokeless tobacco: Not on file  . Alcohol Use: No  . Drug Use: No  . Sexual Activity: Not on file   Other Topics Concern  . Not on file    Social History Narrative  . No narrative on file    Review of Systems: See HPI, otherwise negative ROS  Physical Exam: BP 150/84  Pulse 79  Temp(Src) 97.4 F (36.3 C) (Oral)  Wt 244 lb 12.8 oz (111.041 kg) General:   Alert,  Well-developed, well-nourished, pleasant and cooperative in NAD Detailed exam deferred today   Impression:   52 year old gentleman with anorectal symptoms most consistent with at least, in part, secondary to anal fissure. He likely has hemorrhoids as well which may be contributing to some of his symptoms. Discussed the nature hemorrhoids and anal fissure disease with the patient at some length today  Recommendations:  Strongly recommend fiber supplementation everyday whether or not he feels he needs it. Continue nitroglycerin daily for another month or 2. Chronic, recurrent nature of this entitiy reviewed as well.  We'll see him back in 4 months. He may have an element of symptomatic hemorrhoids and, consequently, he also benefit from hemorrhoidal banding at some point in the future. If he has any interim issues he is urged to give me a call.

## 2013-08-31 ENCOUNTER — Emergency Department (HOSPITAL_COMMUNITY)
Admission: EM | Admit: 2013-08-31 | Discharge: 2013-08-31 | Disposition: A | Discharge status: Home / Self Care | Payer: BC Managed Care – PPO | Attending: Emergency Medicine | Admitting: Emergency Medicine

## 2013-08-31 ENCOUNTER — Emergency Department (HOSPITAL_COMMUNITY): Payer: BC Managed Care – PPO

## 2013-08-31 ENCOUNTER — Encounter (HOSPITAL_COMMUNITY): Payer: Self-pay | Admitting: Emergency Medicine

## 2013-08-31 DIAGNOSIS — Z860100 Personal history of colon polyps, unspecified: Secondary | ICD-10-CM | POA: Insufficient documentation

## 2013-08-31 DIAGNOSIS — Z79899 Other long term (current) drug therapy: Secondary | ICD-10-CM | POA: Insufficient documentation

## 2013-08-31 DIAGNOSIS — Z8709 Personal history of other diseases of the respiratory system: Secondary | ICD-10-CM | POA: Insufficient documentation

## 2013-08-31 DIAGNOSIS — I10 Essential (primary) hypertension: Secondary | ICD-10-CM | POA: Insufficient documentation

## 2013-08-31 DIAGNOSIS — Z8739 Personal history of other diseases of the musculoskeletal system and connective tissue: Secondary | ICD-10-CM | POA: Insufficient documentation

## 2013-08-31 DIAGNOSIS — N200 Calculus of kidney: Secondary | ICD-10-CM | POA: Insufficient documentation

## 2013-08-31 DIAGNOSIS — Z87891 Personal history of nicotine dependence: Secondary | ICD-10-CM | POA: Insufficient documentation

## 2013-08-31 DIAGNOSIS — Z8719 Personal history of other diseases of the digestive system: Secondary | ICD-10-CM | POA: Insufficient documentation

## 2013-08-31 DIAGNOSIS — Z9889 Other specified postprocedural states: Secondary | ICD-10-CM | POA: Insufficient documentation

## 2013-08-31 LAB — URINE MICROSCOPIC-ADD ON

## 2013-08-31 LAB — URINALYSIS, ROUTINE W REFLEX MICROSCOPIC
BILIRUBIN URINE: NEGATIVE
Glucose, UA: NEGATIVE mg/dL
Ketones, ur: NEGATIVE mg/dL
Leukocytes, UA: NEGATIVE
NITRITE: NEGATIVE
Protein, ur: NEGATIVE mg/dL
SPECIFIC GRAVITY, URINE: 1.01 (ref 1.005–1.030)
UROBILINOGEN UA: 0.2 mg/dL (ref 0.0–1.0)
pH: 6 (ref 5.0–8.0)

## 2013-08-31 MED ORDER — SODIUM CHLORIDE 0.9 % IV BOLUS (SEPSIS)
500.0000 mL | Freq: Once | INTRAVENOUS | Status: AC
Start: 2013-08-31 — End: 2013-08-31
  Administered 2013-08-31: 500 mL via INTRAVENOUS

## 2013-08-31 MED ORDER — OXYCODONE-ACETAMINOPHEN 5-325 MG PO TABS: 2.0000 | ORAL_TABLET | ORAL | Status: DC | PRN

## 2013-08-31 MED ORDER — HYDROMORPHONE HCL PF 1 MG/ML IJ SOLN
0.5000 mg | Freq: Once | INTRAMUSCULAR | Status: AC
  Administered 2013-08-31: 0.5 mg via INTRAVENOUS

## 2013-08-31 MED ORDER — ONDANSETRON HCL 4 MG/2ML IJ SOLN
4.0000 mg | Freq: Once | INTRAMUSCULAR | Status: AC
  Administered 2013-08-31: 4 mg via INTRAVENOUS

## 2013-08-31 MED ORDER — HYDROMORPHONE HCL PF 1 MG/ML IJ SOLN
1.0000 mg | Freq: Once | INTRAMUSCULAR | Status: AC
  Administered 2013-08-31: 1 mg via INTRAVENOUS
  Filled 2013-08-31: qty 1

## 2013-08-31 MED ORDER — HYDROMORPHONE HCL PF 1 MG/ML IJ SOLN
INTRAMUSCULAR | Status: AC
  Filled 2013-08-31: qty 1

## 2013-08-31 MED ORDER — ONDANSETRON HCL 4 MG/2ML IJ SOLN
4.0000 mg | Freq: Once | INTRAMUSCULAR | Status: AC
  Administered 2013-08-31: 4 mg via INTRAVENOUS
  Filled 2013-08-31: qty 2

## 2013-08-31 MED ORDER — PROMETHAZINE HCL 25 MG PO TABS: 25.0000 mg | ORAL_TABLET | Freq: Four times a day (QID) | ORAL | Status: DC | PRN

## 2013-08-31 MED ORDER — ONDANSETRON HCL 4 MG/2ML IJ SOLN
INTRAMUSCULAR | Status: AC
Start: 2013-08-31 — End: 2013-08-31
  Administered 2013-08-31: 4 mg via INTRAVENOUS
  Filled 2013-08-31: qty 2

## 2013-08-31 NOTE — Discharge Instructions (Signed)
Kidney Stones Kidney stones (urolithiasis) are solid masses that form inside your kidneys. The intense pain is caused by the stone moving through the kidney, ureter, bladder, and urethra (urinary tract). When the stone moves, the ureter starts to spasm around the stone. The stone is usually passed in your pee (urine).  HOME CARE  Drink enough fluids to keep your pee clear or pale yellow. This helps to get the stone out.  Strain all pee through the provided strainer. Do not pee without peeing through the strainer, not even once. If you pee the stone out, catch it in the strainer. The stone may be as small as a grain of salt. Take this to your doctor. This will help your doctor figure out what you can do to try to prevent more kidney stones.  Only take medicine as told by your doctor.  Follow up with your doctor as told.  Get follow-up X-rays as told by your doctor. GET HELP IF: You have pain that gets worse even if you have been taking pain medicine. GET HELP RIGHT AWAY IF:   Your pain does not get better with medicine.  You have a fever or shaking chills.  Your pain increases and gets worse over 18 hours.  You have new belly (abdominal) pain.  You feel faint or pass out.  You are unable to pee. MAKE SURE YOU:   Understand these instructions.  Will watch your condition.  Will get help right away if you are not doing well or get worse. Document Released: 08/13/2007 Document Revised: 10/27/2012 Document Reviewed: 07/28/2012 [facility] Patient Information 2015 [place], [place]. This information is not intended to replace advice given to you by your health care provider. Make sure you discuss any questions you have with your health care provider.   You have a 1-2 mm kidney stones in your ureter which is a tube that connects the kidney with the bladder.    Increase fluids. Medication for pain and nausea.   Followup with urologist for continued issues

## 2013-08-31 NOTE — ED Notes (Signed)
Pt vomited large amount of yellowish emesis. C/o of unrelieved N/V and pain. Cook aware, Medicatications given per his orders.

## 2013-08-31 NOTE — ED Notes (Signed)
Pt denies all pain and nausea 

## 2013-08-31 NOTE — ED Notes (Signed)
Left flank pain with nausea starting this morning.

## 2013-08-31 NOTE — ED Provider Notes (Signed)
CSN: [id number]     Arrival date & time 08/31/13  4098 History  This chart was scribed for Nat Christen, MD by Vernell Barrier, ED scribe. This patient was seen in room APA05/APA05 and the patient's care was started at 8:54 AM.    Chief Complaint  Patient presents with  . Flank Pain    The history is provided by the patient. No language interpreter was used.   HPI Comments: Marcus Frazier is a 53 y.o. male w/ hx of HTN presents to the Emergency Department complaining of gradually improving left flank pain; onset early this morning. Pain inially began in LLQ abdomen. Currently vomiting. Suspected of kidney stone in the past but none was ever found.  No fever, chills, dysuria. Decreased urinary stream.  Severity is moderate to severe  PCP: Larene Pickett  Past Medical History  Diagnosis Date  . Diverticula of colon   . HTN (hypertension)   . Back pain   . Herniated disc, cervical   . Seasonal allergies   . Colon polyps    Past Surgical History  Procedure Laterality Date  . Colonoscopy  08/20/2004    diverticula  . Cervical fusion      C4-C6  . Colonoscopy  05/12/2011    Rourk- normal rectum, sigmoid diverticulosis, remainder of colonic mucosa appeared normal.   Family History  Problem Relation Age of Onset  . Colon cancer Neg Hx    History  Substance Use Topics  . Smoking status: Former Smoker -- 1.00 packs/day for 4 years    Types: Cigarettes  . Smokeless tobacco: Not on file  . Alcohol Use: No    Review of Systems  Gastrointestinal: Positive for nausea and vomiting.  Genitourinary: Positive for flank pain.   A complete 10 system review of systems was obtained and all systems are negative except as noted in the HPI and PMH.   Allergies  Review of patient's allergies indicates no known allergies.  Home Medications   Prior to Admission medications   Medication Sig Start Date End Date Taking? Authorizing Tannon Peerson  DIOVAN HCT 160-12.5 MG per tablet Take 0.5-1 tablets by  mouth daily.  04/16/11  Yes Historical Ladd Cen, MD  Fiber POWD Take 2 scoop by mouth daily.   Yes Historical Floy Riegler, MD  oxyCODONE-acetaminophen (PERCOCET) 5-325 MG per tablet Take 2 tablets by mouth every 4 (four) hours as needed. 08/31/13   Nat Christen, MD  promethazine (PHENERGAN) 25 MG tablet Take 1 tablet (25 mg total) by mouth every 6 (six) hours as needed. 08/31/13   Nat Christen, MD   Triage vitals: BP 143/98  Pulse 57  Temp(Src) 97.7 F (36.5 C) (Oral)  Resp 20  Ht 5\' 10"  (1.778 m)  Wt 240 lb (108.863 kg)  BMI 34.44 kg/m2  SpO2 100%  Physical Exam  Nursing note and vitals reviewed. Constitutional: He is oriented to person, place, and time. He appears well-developed and well-nourished.  Vomiting, pale  HENT:  Head: Normocephalic and atraumatic.  Eyes: Conjunctivae and EOM are normal. Pupils are equal, round, and reactive to light.  Neck: Normal range of motion. Neck supple.  Cardiovascular: Normal rate, regular rhythm and normal heart sounds.   Pulmonary/Chest: Effort normal and breath sounds normal.  Abdominal: Soft. Bowel sounds are normal. There is tenderness in the left lower quadrant.  Minimal flank tenderness and LLQ tenderness.  Musculoskeletal: Normal range of motion.  Neurological: He is alert and oriented to person, place, and time.  Skin: Skin is warm and  dry. There is pallor.  Psychiatric: He has a normal mood and affect. His behavior is normal.    ED Course  Procedures (including critical care time) DIAGNOSTIC STUDIES: Oxygen Saturation is 100% on room air, normal by my interpretation.    COORDINATION OF CARE: At 8:56 AM: Discussed treatment plan with patient which includes pain medication and nausea medication. Will also perform CT scan of the abdomen. Patient agrees.    Labs Review Results for orders placed during the hospital encounter of 08/31/13  URINALYSIS, ROUTINE W REFLEX MICROSCOPIC      Result Value Ref Range   Color, Urine YELLOW  YELLOW    APPearance CLEAR  CLEAR   Specific Gravity, Urine 1.010  1.005 - 1.030   pH 6.0  5.0 - 8.0   Glucose, UA NEGATIVE  NEGATIVE mg/dL   Hgb urine dipstick SMALL (*) NEGATIVE   Bilirubin Urine NEGATIVE  NEGATIVE   Ketones, ur NEGATIVE  NEGATIVE mg/dL   Protein, ur NEGATIVE  NEGATIVE mg/dL   Urobilinogen, UA 0.2  0.0 - 1.0 mg/dL   Nitrite NEGATIVE  NEGATIVE   Leukocytes, UA NEGATIVE  NEGATIVE  URINE MICROSCOPIC-ADD ON      Result Value Ref Range   RBC / HPF 7-10  <3 RBC/hpf   Ct Abdomen Pelvis Wo Contrast  08/31/2013   CLINICAL DATA:  Left-sided flank pain  EXAM: CT ABDOMEN AND PELVIS WITHOUT CONTRAST  TECHNIQUE: Multidetector CT imaging of the abdomen and pelvis was performed following the standard protocol without IV contrast.  COMPARISON:  None.  FINDINGS: Lung bases are free of acute infiltrate or sizable effusion.  The liver, gallbladder, spleen, adrenal glands and pancreas are within normal limits. The kidneys are well visualized bilaterally and reveal nonobstructing renal stones bilaterally. The right collecting system and ureter are within normal limits. The left collecting system is mildly prominent as is the proximal left ureter. This extends to the level ureterovesical junction at which point, a 1-2 mm stone is noted in the distal left ureter causing the obstructive change. This is best seen on image number 88 of series 2.  The bladder is well distended. No pelvic mass lesion is seen. The appendix is within normal limits. No significant diverticular change is noted. No acute bony abnormality is seen.  IMPRESSION: Small distal left ureteral stone with obstructive change.   Electronically Signed   By: Inez Catalina M.D.   On: 08/31/2013 09:51    EKG Interpretation None      MDM   Final diagnoses:  Kidney stone on left side   CT scan confirms a small distal left ureteral stone. Patient feels much better after IV and pain management. Discharge medications Percocet and Phenergan 25 mg.  I  personally performed the services described in this documentation, which was scribed in my presence. The recorded information has been reviewed and is accurate.     Nat Christen, MD 08/31/13 1341

## 2013-08-31 NOTE — ED Notes (Signed)
Patient unable to urinate at this time. Active vomiting in room. Restless, pale, clammy.

## 2016-04-17 ENCOUNTER — Encounter: Payer: Self-pay | Admitting: Internal Medicine

## 2016-07-11 ENCOUNTER — Other Ambulatory Visit: Payer: Self-pay

## 2016-07-11 ENCOUNTER — Ambulatory Visit (INDEPENDENT_AMBULATORY_CARE_PROVIDER_SITE_OTHER): Payer: BLUE CROSS/BLUE SHIELD | Admitting: Gastroenterology

## 2016-07-11 ENCOUNTER — Encounter: Payer: Self-pay | Admitting: Gastroenterology

## 2016-07-11 DIAGNOSIS — Z86010 Personal history of colonic polyps: Secondary | ICD-10-CM

## 2016-07-11 DIAGNOSIS — R1032 Left lower quadrant pain: Secondary | ICD-10-CM | POA: Diagnosis not present

## 2016-07-11 DIAGNOSIS — R1310 Dysphagia, unspecified: Secondary | ICD-10-CM

## 2016-07-11 DIAGNOSIS — G8929 Other chronic pain: Secondary | ICD-10-CM

## 2016-07-11 MED ORDER — PEG 3350-KCL-NA BICARB-NACL 420 G PO SOLR: 4000.0000 mL | ORAL | 0 refills | Status: DC

## 2016-07-11 NOTE — Patient Instructions (Signed)
1. Colonoscopy and upper endoscopy as scheduled. Please see separate instructions. 

## 2016-07-11 NOTE — Progress Notes (Signed)
Primary Care Physician:  Redmond School, MD  Primary Gastroenterologist:  Garfield Cornea, MD   Chief Complaint  Patient presents with  . Dysphagia    HPI:  Marcus Frazier is a 56 y.o. male here for further evaluation of dysphagia and he is due for surveillance colonoscopy for history of colon polyps. Last colonoscopy March 2013, diverticulosis only at that time.  Two months intermittent dysphagia. More with liquids but also with solids. Feels like adam's apple not moving back in "timely fashion". Cervical fusion about 9 years ago. Has had to chew more thoroughly. No painful swallowing. No choking or coughing fits. Not a lot of heartburn. Sometimes at night. Cut way back on soft drinks. Not big issues lately. BM normal, on fiber powder. Occasional hemorrhoid issues. No melena. Rare brbpr ?hemorrhoids.   LLQ pain occurs every day but intermittent, 1 out of 10. More of nuissance but worried because new symptom. Doesn't hurt when press on it. No alleviated or modifying factors for sure. Runs own business, with stress than LLQ pain starts coming on.   Patient desires ct imaging of chest and abdomen if no sources found on eg/tcs. Friend had tumor in chest and his symptom was dysphagia.    Current Outpatient Prescriptions  Medication Sig Dispense Refill  . DIOVAN HCT 160-12.5 MG per tablet Take 0.5-1 tablets by mouth daily.     . Fiber POWD Take 2 scoop by mouth daily.    . polyethylene glycol-electrolytes (TRILYTE) 420 g solution Take 4,000 mLs by mouth as directed. 4000 mL 0   No current facility-administered medications for this visit.     Allergies as of 07/11/2016  . (No Known Allergies)    Past Medical History:  Diagnosis Date  . Back pain   . Colon polyps   . Diverticula of colon   . Herniated disc, cervical   . HTN (hypertension)   . Kidney stones   . Seasonal allergies     Past Surgical History:  Procedure Laterality Date  . CERVICAL FUSION     C4-C6  . COLONOSCOPY   08/20/2004   diverticula  . COLONOSCOPY  05/12/2011   Rourk- normal rectum, sigmoid diverticulosis, remainder of colonic mucosa appeared normal.    Family History  Problem Relation Age of Onset  . Colon cancer Neg Hx     Social History   Social History  . Marital status: Married    Spouse name: N/A  . Number of children: N/A  . Years of education: N/A   Occupational History  . Not on file.   Social History Main Topics  . Smoking status: Former Smoker    Packs/day: 1.00    Years: 4.00    Types: Cigarettes  . Smokeless tobacco: Never Used  . Alcohol use No  . Drug use: No  . Sexual activity: Not on file   Other Topics Concern  . Not on file   Social History Narrative  . No narrative on file      ROS:  General: Negative for anorexia, weight loss, fever, chills, fatigue, weakness. Eyes: Negative for vision changes.  ENT: Negative for hoarseness,  nasal congestion.see hpi CV: Negative for chest pain, angina, palpitations, dyspnea on exertion, peripheral edema.  Respiratory: Negative for dyspnea at rest, dyspnea on exertion, cough, sputum, wheezing.  GI: See history of present illness. GU:  Negative for dysuria, hematuria, urinary incontinence, urinary frequency, nocturnal urination.  MS: Negative for joint pain, low back pain.  Derm: Negative for rash  or itching.  Neuro: Negative for weakness, abnormal sensation, seizure, frequent headaches, memory loss, confusion.  Psych: Negative for anxiety, depression, suicidal ideation, hallucinations.  Endo: Negative for unusual weight change.  Heme: Negative for bruising or bleeding. Allergy: Negative for rash or hives.    Physical Examination:  BP 127/75   Pulse 64   Temp 98.3 F (36.8 C) (Oral)   Ht 5\' 10"  (1.778 m)   Wt 248 lb 6.4 oz (112.7 kg)   BMI 35.64 kg/m    General: Well-nourished, well-developed in no acute distress.  Head: Normocephalic, atraumatic.   Eyes: Conjunctiva pink, no icterus. Mouth:  Oropharyngeal mucosa moist and pink , no lesions erythema or exudate. Neck: Supple without thyromegaly, masses, or lymphadenopathy.  Lungs: Clear to auscultation bilaterally.  Heart: Regular rate and rhythm, no murmurs rubs or gallops.  Abdomen: Bowel sounds are normal, nontender, nondistended, no hepatosplenomegaly or masses, no abdominal bruits or    hernia , no rebound or guarding.   Rectal: not performed Extremities: No lower extremity edema. No clubbing or deformities.  Neuro: Alert and oriented x 4 , grossly normal neurologically.  Skin: Warm and dry, no rash or jaundice.   Psych: Alert and cooperative, normal mood and affect.   Imaging Studies: No results found.

## 2016-07-14 ENCOUNTER — Encounter: Payer: Self-pay | Admitting: Gastroenterology

## 2016-07-14 NOTE — Assessment & Plan Note (Signed)
Somewhat vague symptoms. ?related to esophageal web/stricture/reflux vs prior cervical disc surgery vs anxiety. Friend had tumor in chest causing swallowing issues found on CT.   Recommend EGD+/-ED in near future.  I have discussed the risks, alternatives, benefits with regards to but not limited to the risk of reaction to medication, bleeding, infection, perforation and the patient is agreeable to proceed. Written consent to be obtained.

## 2016-07-14 NOTE — Assessment & Plan Note (Signed)
Surveillance colonoscopy in near future.  I have discussed the risks, alternatives, benefits with regards to but not limited to the risk of reaction to medication, bleeding, infection, perforation and the patient is agreeable to proceed. Written consent to be obtained.

## 2016-07-14 NOTE — Assessment & Plan Note (Signed)
New LLQ pain, worse with stress, unrelated to BMs. Vague, mild pain. Await colonoscopy findings. Patient may require CT imaging to complete work up.

## 2016-07-15 NOTE — Progress Notes (Signed)
cc'ed to pcp °

## 2016-08-08 ENCOUNTER — Encounter (HOSPITAL_COMMUNITY): Payer: Self-pay | Admitting: *Deleted

## 2016-08-08 ENCOUNTER — Encounter (HOSPITAL_COMMUNITY)
Admission: RE | Disposition: A | Discharge status: Home / Self Care | Payer: Self-pay | Source: Ambulatory Visit | Attending: Internal Medicine

## 2016-08-08 ENCOUNTER — Ambulatory Visit (HOSPITAL_COMMUNITY)
Admission: RE | Admit: 2016-08-08 | Discharge: 2016-08-08 | Disposition: A | Discharge status: Home / Self Care | Payer: BLUE CROSS/BLUE SHIELD | Source: Ambulatory Visit | Attending: Internal Medicine | Admitting: Internal Medicine

## 2016-08-08 DIAGNOSIS — I10 Essential (primary) hypertension: Secondary | ICD-10-CM | POA: Diagnosis not present

## 2016-08-08 DIAGNOSIS — K210 Gastro-esophageal reflux disease with esophagitis: Secondary | ICD-10-CM | POA: Diagnosis not present

## 2016-08-08 DIAGNOSIS — Z1211 Encounter for screening for malignant neoplasm of colon: Secondary | ICD-10-CM | POA: Insufficient documentation

## 2016-08-08 DIAGNOSIS — K449 Diaphragmatic hernia without obstruction or gangrene: Secondary | ICD-10-CM | POA: Diagnosis not present

## 2016-08-08 DIAGNOSIS — R1032 Left lower quadrant pain: Secondary | ICD-10-CM

## 2016-08-08 DIAGNOSIS — Z86010 Personal history of colonic polyps: Secondary | ICD-10-CM | POA: Diagnosis not present

## 2016-08-08 DIAGNOSIS — M549 Dorsalgia, unspecified: Secondary | ICD-10-CM | POA: Insufficient documentation

## 2016-08-08 DIAGNOSIS — Z87442 Personal history of urinary calculi: Secondary | ICD-10-CM | POA: Diagnosis not present

## 2016-08-08 DIAGNOSIS — K209 Esophagitis, unspecified: Secondary | ICD-10-CM | POA: Diagnosis not present

## 2016-08-08 DIAGNOSIS — K5730 Diverticulosis of large intestine without perforation or abscess without bleeding: Secondary | ICD-10-CM | POA: Insufficient documentation

## 2016-08-08 DIAGNOSIS — R1310 Dysphagia, unspecified: Secondary | ICD-10-CM | POA: Diagnosis not present

## 2016-08-08 DIAGNOSIS — Z79899 Other long term (current) drug therapy: Secondary | ICD-10-CM | POA: Insufficient documentation

## 2016-08-08 DIAGNOSIS — Z981 Arthrodesis status: Secondary | ICD-10-CM | POA: Diagnosis not present

## 2016-08-08 DIAGNOSIS — Z87891 Personal history of nicotine dependence: Secondary | ICD-10-CM | POA: Insufficient documentation

## 2016-08-08 DIAGNOSIS — D120 Benign neoplasm of cecum: Secondary | ICD-10-CM | POA: Insufficient documentation

## 2016-08-08 HISTORY — PX: ESOPHAGOGASTRODUODENOSCOPY: SHX5428

## 2016-08-08 HISTORY — DX: Unspecified injury of right ankle, initial encounter: S99.911A

## 2016-08-08 HISTORY — PX: COLONOSCOPY: SHX5424

## 2016-08-08 HISTORY — PX: MALONEY DILATION: SHX5535

## 2016-08-08 SURGERY — COLONOSCOPY
Anesthesia: Moderate Sedation

## 2016-08-08 MED ORDER — LIDOCAINE VISCOUS 2 % MT SOLN
OROMUCOSAL | Status: AC
  Filled 2016-08-08: qty 15

## 2016-08-08 MED ORDER — ONDANSETRON HCL 4 MG/2ML IJ SOLN
INTRAMUSCULAR | Status: AC
  Filled 2016-08-08: qty 2

## 2016-08-08 MED ORDER — MIDAZOLAM HCL 5 MG/5ML IJ SOLN
INTRAMUSCULAR | Status: AC
  Filled 2016-08-08: qty 10

## 2016-08-08 MED ORDER — MIDAZOLAM HCL 5 MG/5ML IJ SOLN
INTRAMUSCULAR | Status: DC | PRN
  Administered 2016-08-08 (×2): 1 mg via INTRAVENOUS
  Administered 2016-08-08: 2 mg via INTRAVENOUS
  Administered 2016-08-08: 1 mg via INTRAVENOUS
  Administered 2016-08-08: 2 mg via INTRAVENOUS

## 2016-08-08 MED ORDER — LIDOCAINE VISCOUS 2 % MT SOLN
OROMUCOSAL | Status: DC | PRN
  Administered 2016-08-08: 1 via OROMUCOSAL

## 2016-08-08 MED ORDER — SODIUM CHLORIDE 0.9 % IV SOLN
INTRAVENOUS | Status: DC
  Administered 2016-08-08: 10:00:00 via INTRAVENOUS

## 2016-08-08 MED ORDER — MEPERIDINE HCL 100 MG/ML IJ SOLN
INTRAMUSCULAR | Status: DC | PRN
  Administered 2016-08-08: 25 mg via INTRAVENOUS
  Administered 2016-08-08 (×2): 50 mg via INTRAVENOUS

## 2016-08-08 MED ORDER — MEPERIDINE HCL 100 MG/ML IJ SOLN
INTRAMUSCULAR | Status: AC
  Filled 2016-08-08: qty 2

## 2016-08-08 NOTE — Interval H&P Note (Signed)
History and Physical Interval Note:  08/08/2016 9:59 AM  Marcus Frazier  has presented today for surgery, with the diagnosis of dysphagia/colon polyps/LLQ PAIN  The various methods of treatment have been discussed with the patient and family. After consideration of risks, benefits and other options for treatment, the patient has consented to  Procedure(s) with comments: COLONOSCOPY (N/A) - 1030  ESOPHAGOGASTRODUODENOSCOPY (EGD) (N/A) MALONEY DILATION (N/A) as a surgical intervention .  The patient's history has been reviewed, patient examined, no change in status, stable for surgery.  I have reviewed the patient's chart and labs.  Questions were answered to the patient's satisfaction.     Marcus Frazier  No change. EGD with EGD as appropriate and surveillance colonoscopy per plan.  The risks, benefits, limitations, imponderables and alternatives regarding both EGD and colonoscopy have been reviewed with the patient. Questions have been answered. All parties agreeable.

## 2016-08-08 NOTE — Op Note (Signed)
[facility] Patient Name: Marcus Frazier Procedure Date: 08/08/2016 9:55 AM MRN: [id number] Date of Birth: 02/18/1961 Attending MD: Norvel Richards , MD CSN: [id number] Age: 56 Admit Type: Outpatient Procedure:                Upper GI endoscopy Indications:              Dysphagia Providers:                Norvel Richards, MD, Lurline Del, RN, Randa Spike, Technician Referring MD:              Medicines:                Midazolam 6 mg IV, Meperidine 125 mg IV,                            Ondansetron 4 mg IV Complications:            No immediate complications. Estimated Blood Loss:     Estimated blood loss was minimal. Procedure:                Pre-Anesthesia Assessment:                           - Prior to the procedure, a History and Physical                            was performed, and patient medications and                            allergies were reviewed. The patient's tolerance of                            previous anesthesia was also reviewed. The risks                            and benefits of the procedure and the sedation                            options and risks were discussed with the patient.                            All questions were answered, and informed consent                            was obtained. Prior Anticoagulants: The patient has                            taken no previous anticoagulant or antiplatelet                            agents. ASA Grade Assessment: II - A patient with  mild systemic disease. After reviewing the risks                            and benefits, the patient was deemed in                            satisfactory condition to undergo the procedure.                           After obtaining informed consent, the endoscope was                            passed under direct vision. Throughout the                            procedure, the patient's blood pressure,  pulse, and                            oxygen saturations were monitored continuously. The                            [id number] ([id number]) scope was introduced through the                            mouth, and advanced to the second part of duodenum.                            The upper GI endoscopy was accomplished without                            difficulty. The patient tolerated the procedure                            well. Scope In: 10:10:42 AM Scope Out: 10:20:27 AM Total Procedure Duration: 0 hours 9 minutes 45 seconds  Findings:      LA Grade C (one or more mucosal breaks continuous between tops of 2 or       more mucosal folds, less than 75% circumference) esophagitis was found       36 to 39 cm from the incisors.      A medium-sized hiatal hernia was present.      The exam was otherwise without abnormality.      The duodenal bulb and second portion of the duodenum were normal. The       scope was withdrawn. Dilation was performed with a Maloney dilator with       no resistance at 67 Fr. The scope was withdrawn. Dilation was performed       with a Maloney dilator with mild resistance at 56 Fr. The dilation site       was examined following endoscope reinsertion and showed moderate       improvement in luminal narrowing. Estimated blood loss was minimal. Impression:               - LA Grade C esophagitis. Dilated.                           -  Medium-sized hiatal hernia.                           - The examination was otherwise normal.                           - Normal duodenal bulb and second portion of the                            duodenum.                           - No specimens collected. Moderate Sedation:      Moderate (conscious) sedation was administered by the endoscopy nurse       and supervised by the endoscopist. The following parameters were       monitored: oxygen saturation, heart rate, blood pressure, respiratory       rate, EKG, adequacy of pulmonary  ventilation, and response to care.       Total physician intraservice time was 18 minutes. Recommendation:           - Patient has a contact number available for                            emergencies. The signs and symptoms of potential                            delayed complications were discussed with the                            patient. Return to normal activities tomorrow.                            Written discharge instructions were provided to the                            patient.                           - Resume previous diet.                           - Continue present medications. Begin Protonix 40                            mg twice daily. See colonoscopy report.                           - Return to GI office in 2 months. Procedure Code(s):        --- Professional ---                           [phone], Esophagogastroduodenoscopy, flexible,                            transoral; diagnostic, including collection of  specimen(s) by brushing or washing, when performed                            (separate procedure)                           43450, Dilation of esophagus, by unguided sound or                            bougie, single or multiple passes                           99152, Moderate sedation services provided by the                            same physician or other qualified health care                            professional performing the diagnostic or                            therapeutic service that the sedation supports,                            requiring the presence of an independent trained                            observer to assist in the monitoring of the                            patient's level of consciousness and physiological                            status; initial 15 minutes of intraservice time,                            patient age 69 years or older Diagnosis Code(s):        --- Professional ---                            K20.9, Esophagitis, unspecified                           K44.9, Diaphragmatic hernia without obstruction or                            gangrene                           R13.10, Dysphagia, unspecified CPT copyright 2016 [employer]. All rights reserved. The codes documented in this report are preliminary and upon coder review may  be revised to meet current compliance requirements. Cristopher Estimable. Dniyah Grant, MD Norvel Richards, MD 08/08/2016 10:55:12 AM This report has been signed electronically. Number of Addenda: 0

## 2016-08-08 NOTE — Op Note (Signed)
[facility] Patient Name: Marcus Frazier Procedure Date: 08/08/2016 10:22 AM MRN: [id number] Date of Birth: [birth year] Attending MD: Norvel Richards , MD CSN: [id number] Age: 56 Admit Type: Outpatient Procedure:                Colonoscopy Indications:              High risk colon cancer surveillance: Personal                            history of colonic polyps Providers:                Norvel Richards, MD, Lurline Del, RN, Randa Spike, Technician Referring MD:              Medicines:                Midazolam 7 mg IV, Meperidine 75 mg IV, Ondansetron                            4 mg IV Complications:            No immediate complications. Estimated Blood Loss:     Estimated blood loss was minimal. Procedure:                Pre-Anesthesia Assessment:                           - Prior to the procedure, a History and Physical                            was performed, and patient medications and                            allergies were reviewed. The patient's tolerance of                            previous anesthesia was also reviewed. The risks                            and benefits of the procedure and the sedation                            options and risks were discussed with the patient.                            All questions were answered, and informed consent                            was obtained. Prior Anticoagulants: The patient has                            taken no previous anticoagulant or antiplatelet  agents. ASA Grade Assessment: II - A patient with                            mild systemic disease. After reviewing the risks                            and benefits, the patient was deemed in                            satisfactory condition to undergo the procedure.                           After obtaining informed consent, the colonoscope                            was passed under direct vision.  Throughout the                            procedure, the patient's blood pressure, pulse, and                            oxygen saturations were monitored continuously. The                            Colonoscope was introduced through the anus and                            advanced to the the cecum, identified by                            appendiceal orifice and ileocecal valve. The                            colonoscopy was performed without difficulty. The                            patient tolerated the procedure well. The quality                            of the bowel preparation was adequate. The                            ileocecal valve, appendiceal orifice, and rectum                            were photographed. The colonoscopy was performed                            without difficulty. Scope In: 10:26:40 AM Scope Out: 10:43:12 AM Scope Withdrawal Time: 0 hours 11 minutes 56 seconds  Total Procedure Duration: 0 hours 16 minutes 32 seconds  Findings:      The perianal and digital rectal examinations were normal.      A 5 mm polyp was found in the cecum. The polyp was  sessile.      Multiple small and large-mouthed diverticula were found in the sigmoid       colon.      The exam was otherwise without abnormality on direct and retroflexion       views. The polyp was removed with a cold snare. Resection and retrieval       were complete. Estimated blood loss was minimal. Impression:               - One 5 mm polyp in the cecum, removed with a cold                            snare. Resected and retrieved.                           - Diverticulosis in the sigmoid colon.                           - The examination was otherwise normal on direct                            and retroflexion views. Moderate Sedation:      Moderate (conscious) sedation was administered by the endoscopy nurse       and supervised by the endoscopist. The following parameters were       monitored: oxygen  saturation, heart rate, blood pressure, respiratory       rate, EKG, adequacy of pulmonary ventilation, and response to care.       Total physician intraservice time was 41 minutes. Recommendation:           - Patient has a contact number available for                            emergencies. The signs and symptoms of potential                            delayed complications were discussed with the                            patient. Return to normal activities tomorrow.                            Written discharge instructions were provided to the                            patient.                           - Resume previous diet.                           - Continue present medications.                           - Repeat colonoscopy date to be determined after  pending pathology results are reviewed for                            surveillance based on pathology results.                           - Return to GI clinic in 8 weeks. See EGD report. Procedure Code(s):        --- Professional ---                           [phone], Colonoscopy, flexible; with removal of                            tumor(s), polyp(s), or other lesion(s) by snare                            technique                           99152, Moderate sedation services provided by the                            same physician or other qualified health care                            professional performing the diagnostic or                            therapeutic service that the sedation supports,                            requiring the presence of an independent trained                            observer to assist in the monitoring of the                            patient's level of consciousness and physiological                            status; initial 15 minutes of intraservice time,                            patient age 49 years or older                           [phone], Moderate sedation  services; each additional                            15 minutes intraservice time                           99153, Moderate sedation services; each additional  15 minutes intraservice time Diagnosis Code(s):        --- Professional ---                           Z86.010, Personal history of colonic polyps                           D12.0, Benign neoplasm of cecum                           K57.30, Diverticulosis of large intestine without                            perforation or abscess without bleeding CPT copyright 2016 [employer] Association. All rights reserved. The codes documented in this report are preliminary and upon coder review may  be revised to meet current compliance requirements. Cristopher Estimable. Chaka Jefferys, MD Norvel Richards, MD 08/08/2016 10:53:38 AM This report has been signed electronically. Number of Addenda: 0

## 2016-08-08 NOTE — H&P (View-Only) (Signed)
Primary Care Physician:  Redmond School, MD  Primary Gastroenterologist:  Garfield Cornea, MD   Chief Complaint  Patient presents with  . Dysphagia    HPI:  Marcus Frazier is a 56 y.o. male here for further evaluation of dysphagia and he is due for surveillance colonoscopy for history of colon polyps. Last colonoscopy March 2013, diverticulosis only at that time.  Two months intermittent dysphagia. More with liquids but also with solids. Feels like adam's apple not moving back in "timely fashion". Cervical fusion about 9 years ago. Has had to chew more thoroughly. No painful swallowing. No choking or coughing fits. Not a lot of heartburn. Sometimes at night. Cut way back on soft drinks. Not big issues lately. BM normal, on fiber powder. Occasional hemorrhoid issues. No melena. Rare brbpr ?hemorrhoids.   LLQ pain occurs every day but intermittent, 1 out of 10. More of nuissance but worried because new symptom. Doesn't hurt when press on it. No alleviated or modifying factors for sure. Runs own business, with stress than LLQ pain starts coming on.   Patient desires ct imaging of chest and abdomen if no sources found on eg/tcs. Friend had tumor in chest and his symptom was dysphagia.    Current Outpatient Prescriptions  Medication Sig Dispense Refill  . DIOVAN HCT 160-12.5 MG per tablet Take 0.5-1 tablets by mouth daily.     . Fiber POWD Take 2 scoop by mouth daily.    . polyethylene glycol-electrolytes (TRILYTE) 420 g solution Take 4,000 mLs by mouth as directed. 4000 mL 0   No current facility-administered medications for this visit.     Allergies as of 07/11/2016  . (No Known Allergies)    Past Medical History:  Diagnosis Date  . Back pain   . Colon polyps   . Diverticula of colon   . Herniated disc, cervical   . HTN (hypertension)   . Kidney stones   . Seasonal allergies     Past Surgical History:  Procedure Laterality Date  . CERVICAL FUSION     C4-C6  . COLONOSCOPY   08/20/2004   diverticula  . COLONOSCOPY  05/12/2011   Rourk- normal rectum, sigmoid diverticulosis, remainder of colonic mucosa appeared normal.    Family History  Problem Relation Age of Onset  . Colon cancer Neg Hx     Social History   Social History  . Marital status: Married    Spouse name: N/A  . Number of children: N/A  . Years of education: N/A   Occupational History  . Not on file.   Social History Main Topics  . Smoking status: Former Smoker    Packs/day: 1.00    Years: 4.00    Types: Cigarettes  . Smokeless tobacco: Never Used  . Alcohol use No  . Drug use: No  . Sexual activity: Not on file   Other Topics Concern  . Not on file   Social History Narrative  . No narrative on file      ROS:  General: Negative for anorexia, weight loss, fever, chills, fatigue, weakness. Eyes: Negative for vision changes.  ENT: Negative for hoarseness,  nasal congestion.see hpi CV: Negative for chest pain, angina, palpitations, dyspnea on exertion, peripheral edema.  Respiratory: Negative for dyspnea at rest, dyspnea on exertion, cough, sputum, wheezing.  GI: See history of present illness. GU:  Negative for dysuria, hematuria, urinary incontinence, urinary frequency, nocturnal urination.  MS: Negative for joint pain, low back pain.  Derm: Negative for rash  or itching.  Neuro: Negative for weakness, abnormal sensation, seizure, frequent headaches, memory loss, confusion.  Psych: Negative for anxiety, depression, suicidal ideation, hallucinations.  Endo: Negative for unusual weight change.  Heme: Negative for bruising or bleeding. Allergy: Negative for rash or hives.    Physical Examination:  BP 127/75   Pulse 64   Temp 98.3 F (36.8 C) (Oral)   Ht 5\' 10"  (1.778 m)   Wt 248 lb 6.4 oz (112.7 kg)   BMI 35.64 kg/m    General: Well-nourished, well-developed in no acute distress.  Head: Normocephalic, atraumatic.   Eyes: Conjunctiva pink, no icterus. Mouth:  Oropharyngeal mucosa moist and pink , no lesions erythema or exudate. Neck: Supple without thyromegaly, masses, or lymphadenopathy.  Lungs: Clear to auscultation bilaterally.  Heart: Regular rate and rhythm, no murmurs rubs or gallops.  Abdomen: Bowel sounds are normal, nontender, nondistended, no hepatosplenomegaly or masses, no abdominal bruits or    hernia , no rebound or guarding.   Rectal: not performed Extremities: No lower extremity edema. No clubbing or deformities.  Neuro: Alert and oriented x 4 , grossly normal neurologically.  Skin: Warm and dry, no rash or jaundice.   Psych: Alert and cooperative, normal mood and affect.   Imaging Studies: No results found.

## 2016-08-08 NOTE — Discharge Instructions (Signed)
GERD information, colon diverticulosis and polyp information provided  Begin Protonix 40 mg twice daily.  Begin Benefiber 1 teaspoon twice daily  Office visit with [place] in 2 months.  Further recommendations to follow pending review of pathology report  Colonoscopy Discharge Instructions  Read the instructions outlined below and refer to this sheet in the next few weeks. These discharge instructions provide you with general information on caring for yourself after you leave the hospital. Your doctor may also give you specific instructions. While your treatment has been planned according to the most current medical practices available, unavoidable complications occasionally occur. If you have any problems or questions after discharge, call Dr. Gala Romney at [phone]. ACTIVITY  You may resume your regular activity, but move at a slower pace for the next 24 hours.   Take frequent rest periods for the next 24 hours.   Walking will help get rid of the air and reduce the bloated feeling in your belly (abdomen).   No driving for 24 hours (because of the medicine (anesthesia) used during the test).    Do not sign any important legal documents or operate any machinery for 24 hours (because of the anesthesia used during the test).  NUTRITION  Drink plenty of fluids.   You may resume your normal diet as instructed by your doctor.   Begin with a light meal and progress to your normal diet. Heavy or fried foods are harder to digest and may make you feel sick to your stomach (nauseated).   Avoid alcoholic beverages for 24 hours or as instructed.  MEDICATIONS  You may resume your normal medications unless your doctor tells you otherwise.  WHAT YOU CAN EXPECT TODAY  Some feelings of bloating in the abdomen.   Passage of more gas than usual.   Spotting of blood in your stool or on the toilet paper.  IF YOU HAD POLYPS REMOVED DURING THE COLONOSCOPY:  No aspirin products for 7 days or as  instructed.   No alcohol for 7 days or as instructed.   Eat a soft diet for the next 24 hours.  FINDING OUT THE RESULTS OF YOUR TEST Not all test results are available during your visit. If your test results are not back during the visit, make an appointment with your caregiver to find out the results. Do not assume everything is normal if you have not heard from your caregiver or the medical facility. It is important for you to follow up on all of your test results.  SEEK IMMEDIATE MEDICAL ATTENTION IF:  You have more than a spotting of blood in your stool.   Your belly is swollen (abdominal distention).   You are nauseated or vomiting.   You have a temperature over 101.   You have abdominal pain or discomfort that is severe or gets worse throughout the day.  EGD Discharge instructions Please read the instructions outlined below and refer to this sheet in the next few weeks. These discharge instructions provide you with general information on caring for yourself after you leave the hospital. Your doctor may also give you specific instructions. While your treatment has been planned according to the most current medical practices available, unavoidable complications occasionally occur. If you have any problems or questions after discharge, please call your doctor. ACTIVITY  You may resume your regular activity but move at a slower pace for the next 24 hours.   Take frequent rest periods for the next 24 hours.   Walking will help expel (  get rid of) the air and reduce the bloated feeling in your abdomen.   No driving for 24 hours (because of the anesthesia (medicine) used during the test).   You may shower.   Do not sign any important legal documents or operate any machinery for 24 hours (because of the anesthesia used during the test).  NUTRITION  Drink plenty of fluids.   You may resume your normal diet.   Begin with a light meal and progress to your normal diet.   Avoid  alcoholic beverages for 24 hours or as instructed by your caregiver.  MEDICATIONS  You may resume your normal medications unless your caregiver tells you otherwise.  WHAT YOU CAN EXPECT TODAY  You may experience abdominal discomfort such as a feeling of fullness or gas pains.  FOLLOW-UP  Your doctor will discuss the results of your test with you.  SEEK IMMEDIATE MEDICAL ATTENTION IF ANY OF THE FOLLOWING OCCUR:  Excessive nausea (feeling sick to your stomach) and/or vomiting.   Severe abdominal pain and distention (swelling).   Trouble swallowing.   Temperature over 101 F (37.8 C).   Rectal bleeding or vomiting of blood.    Gastroesophageal Reflux Disease, Adult Normally, food travels down the esophagus and stays in the stomach to be digested. However, when a person has gastroesophageal reflux disease (GERD), food and stomach acid move back up into the esophagus. When this happens, the esophagus becomes sore and inflamed. Over time, GERD can create small holes (ulcers) in the lining of the esophagus. What are the causes? This condition is caused by a problem with the muscle between the esophagus and the stomach (lower esophageal sphincter, or LES). Normally, the LES muscle closes after food passes through the esophagus to the stomach. When the LES is weakened or abnormal, it does not close properly, and that allows food and stomach acid to go back up into the esophagus. The LES can be weakened by certain dietary substances, medicines, and medical conditions, including:  Tobacco use.  Pregnancy.  Having a hiatal hernia.  Heavy alcohol use.  Certain foods and beverages, such as coffee, chocolate, onions, and peppermint.  What increases the risk? This condition is more likely to develop in:  People who have an increased body weight.  People who have connective tissue disorders.  People who use NSAID medicines.  What are the signs or symptoms? Symptoms of this condition  include:  Heartburn.  Difficult or painful swallowing.  The feeling of having a lump in the throat.  Abitter taste in the mouth.  Bad breath.  Having a large amount of saliva.  Having an upset or bloated stomach.  Belching.  Chest pain.  Shortness of breath or wheezing.  Ongoing (chronic) cough or a night-time cough.  Wearing away of tooth enamel.  Weight loss.  Different conditions can cause chest pain. Make sure to see your health care provider if you experience chest pain. How is this diagnosed? Your health care provider will take a medical history and perform a physical exam. To determine if you have mild or severe GERD, your health care provider may also monitor how you respond to treatment. You may also have other tests, including:  An endoscopy toexamine your stomach and esophagus with a small camera.  A test thatmeasures the acidity level in your esophagus.  A test thatmeasures how much pressure is on your esophagus.  A barium swallow or modified barium swallow to show the shape, size, and functioning of your esophagus.  How is this treated? The goal of treatment is to help relieve your symptoms and to prevent complications. Treatment for this condition may vary depending on how severe your symptoms are. Your health care provider may recommend:  Changes to your diet.  Medicine.  Surgery.  Follow these instructions at home: Diet  Follow a diet as recommended by your health care provider. This may involve avoiding foods and drinks such as: ? Coffee and tea (with or without caffeine). ? Drinks that containalcohol. ? Energy drinks and sports drinks. ? Carbonated drinks or sodas. ? Chocolate and cocoa. ? Peppermint and mint flavorings. ? Garlic and onions. ? Horseradish. ? Spicy and acidic foods, including peppers, chili powder, curry powder, vinegar, hot sauces, and barbecue sauce. ? Citrus fruit juices and citrus fruits, such as oranges, lemons,  and limes. ? Tomato-based foods, such as red sauce, chili, salsa, and pizza with red sauce. ? Fried and fatty foods, such as donuts, french fries, potato chips, and high-fat dressings. ? High-fat meats, such as hot dogs and fatty cuts of red and white meats, such as rib eye steak, sausage, ham, and bacon. ? High-fat dairy items, such as whole milk, butter, and cream cheese.  Eat small, frequent meals instead of large meals.  Avoid drinking large amounts of liquid with your meals.  Avoid eating meals during the 2-3 hours before bedtime.  Avoid lying down right after you eat.  Do not exercise right after you eat. General instructions  Pay attention to any changes in your symptoms.  Take over-the-counter and prescription medicines only as told by your health care provider. Do not take aspirin, ibuprofen, or other NSAIDs unless your health care provider told you to do so.  Do not use any tobacco products, including cigarettes, chewing tobacco, and e-cigarettes. If you need help quitting, ask your health care provider.  Wear loose-fitting clothing. Do not wear anything tight around your waist that causes pressure on your abdomen.  Raise (elevate) the head of your bed 6 inches (15cm).  Try to reduce your stress, such as with yoga or meditation. If you need help reducing stress, ask your health care provider.  If you are overweight, reduce your weight to an amount that is healthy for you. Ask your health care provider for guidance about a safe weight loss goal.  Keep all follow-up visits as told by your health care provider. This is important. Contact a health care provider if:  You have new symptoms.  You have unexplained weight loss.  You have difficulty swallowing, or it hurts to swallow.  You have wheezing or a persistent cough.  Your symptoms do not improve with treatment.  You have a hoarse voice. Get help right away if:  You have pain in your arms, neck, jaw, teeth, or  back.  You feel sweaty, dizzy, or light-headed.  You have chest pain or shortness of breath.  You vomit and your vomit looks like blood or coffee grounds.  You faint.  Your stool is bloody or black.  You cannot swallow, drink, or eat. This information is not intended to replace advice given to you by your health care provider. Make sure you discuss any questions you have with your health care provider. Document Released: 12/04/2004 Document Revised: 07/25/2015 Document Reviewed: 06/21/2014 Elsevier Interactive Patient Education  2017 [employer]   Diverticulosis Diverticulosis is a condition that develops when small pouches (diverticula) form in the wall of the large intestine (colon). The colon is where water is  absorbed and stool is formed. The pouches form when the inside layer of the colon pushes through weak spots in the outer layers of the colon. You may have a few pouches or many of them. What are the causes? The cause of this condition is not known. What increases the risk? The following factors may make you more likely to develop this condition:  Being older than age 27. Your risk for this condition increases with age. Diverticulosis is rare among people younger than age 20. By age 39, many people have it.  Eating a low-fiber diet.  Having frequent constipation.  Being overweight.  Not getting enough exercise.  Smoking.  Taking over-the-counter pain medicines, like aspirin and ibuprofen.  Having a family history of diverticulosis.  What are the signs or symptoms? In most people, there are no symptoms of this condition. If you do have symptoms, they may include:  Bloating.  Cramps in the abdomen.  Constipation or diarrhea.  Pain in the lower left side of the abdomen.  How is this diagnosed? This condition is most often diagnosed during an exam for other colon problems. Because diverticulosis usually has no symptoms, it often cannot be diagnosed  independently. This condition may be diagnosed by:  Using a flexible scope to examine the colon (colonoscopy).  Taking an X-ray of the colon after dye has been put into the colon (barium enema).  Doing a CT scan.  How is this treated? You may not need treatment for this condition if you have never developed an infection related to diverticulosis. If you have had an infection before, treatment may include:  Eating a high-fiber diet. This may include eating more fruits, vegetables, and grains.  Taking a fiber supplement.  Taking a live bacteria supplement (probiotic).  Taking medicine to relax your colon.  Taking antibiotic medicines.  Follow these instructions at home:  Drink 6-8 glasses of water or more each day to prevent constipation.  Try not to strain when you have a bowel movement.  If you have had an infection before: ? Eat more fiber as directed by your health care provider or your diet and nutrition specialist (dietitian). ? Take a fiber supplement or probiotic, if your health care provider approves.  Take over-the-counter and prescription medicines only as told by your health care provider.  If you were prescribed an antibiotic, take it as told by your health care provider. Do not stop taking the antibiotic even if you start to feel better.  Keep all follow-up visits as told by your health care provider. This is important. Contact a health care provider if:  You have pain in your abdomen.  You have bloating.  You have cramps.  You have not had a bowel movement in 3 days. Get help right away if:  Your pain gets worse.  Your bloating becomes very bad.  You have a fever or chills, and your symptoms suddenly get worse.  You vomit.  You have bowel movements that are bloody or black.  You have bleeding from your rectum. Summary  Diverticulosis is a condition that develops when small pouches (diverticula) form in the wall of the large intestine  (colon).  You may have a few pouches or many of them.  This condition is most often diagnosed during an exam for other colon problems.  If you have had an infection related to diverticulosis, treatment may include increasing the fiber in your diet, taking supplements, or taking medicines. This information is not intended to replace  advice given to you by your health care provider. Make sure you discuss any questions you have with your health care provider. Document Released: 11/22/2003 Document Revised: 01/14/2016 Document Reviewed: 01/14/2016 Elsevier Interactive Patient Education  2017 [place].   Colon Polyps Polyps are tissue growths inside the body. Polyps can grow in many places, including the large intestine (colon). A polyp may be a round bump or a mushroom-shaped growth. You could have one polyp or several. Most colon polyps are noncancerous (benign). However, some colon polyps can become cancerous over time. What are the causes? The exact cause of colon polyps is not known. What increases the risk? This condition is more likely to develop in people who:  Have a family history of colon cancer or colon polyps.  Are older than 39 or older than 45 if they are African American.  Have inflammatory bowel disease, such as ulcerative colitis or Crohn disease.  Are overweight.  Smoke cigarettes.  Do not get enough exercise.  Drink too much alcohol.  Eat a diet that is: ? High in fat and red meat. ? Low in fiber.  Had childhood cancer that was treated with abdominal radiation.  What are the signs or symptoms? Most polyps do not cause symptoms. If you have symptoms, they may include:  Blood coming from your rectum when having a bowel movement.  Blood in your stool.The stool may look dark red or black.  A change in bowel habits, such as constipation or diarrhea.  How is this diagnosed? This condition is diagnosed with a colonoscopy. This is a procedure that uses a  lighted, flexible scope to look at the inside of your colon. How is this treated? Treatment for this condition involves removing any polyps that are found. Those polyps will then be tested for cancer. If cancer is found, your health care provider will talk to you about options for colon cancer treatment. Follow these instructions at home: Diet  Eat plenty of fiber, such as fruits, vegetables, and whole grains.  Eat foods that are high in calcium and vitamin D, such as milk, cheese, yogurt, eggs, liver, fish, and broccoli.  Limit foods high in fat, red meats, and processed meats, such as hot dogs, sausage, bacon, and lunch meats.  Maintain a healthy weight, or lose weight if recommended by your health care provider. General instructions  Do not smoke cigarettes.  Do not drink alcohol excessively.  Keep all follow-up visits as told by your health care provider. This is important. This includes keeping regularly scheduled colonoscopies. Talk to your health care provider about when you need a colonoscopy.  Exercise every day or as told by your health care provider. Contact a health care provider if:  You have new or worsening bleeding during a bowel movement.  You have new or increased blood in your stool.  You have a change in bowel habits.  You unexpectedly lose weight. This information is not intended to replace advice given to you by your health care provider. Make sure you discuss any questions you have with your health care provider. Document Released: 11/21/2003 Document Revised: 08/02/2015 Document Reviewed: 01/15/2015 Elsevier Interactive Patient Education  [employer].

## 2016-08-12 ENCOUNTER — Encounter: Payer: Self-pay | Admitting: Orthopaedic Surgery

## 2016-08-12 ENCOUNTER — Ambulatory Visit (INDEPENDENT_AMBULATORY_CARE_PROVIDER_SITE_OTHER): Payer: BLUE CROSS/BLUE SHIELD

## 2016-08-12 ENCOUNTER — Ambulatory Visit (INDEPENDENT_AMBULATORY_CARE_PROVIDER_SITE_OTHER): Payer: BLUE CROSS/BLUE SHIELD | Admitting: Orthopaedic Surgery

## 2016-08-12 VITALS — BP 136/84 | HR 72 | Ht 69.5 in | Wt 240.0 lb

## 2016-08-12 DIAGNOSIS — M8560 Other cyst of bone, unspecified site: Secondary | ICD-10-CM

## 2016-08-12 DIAGNOSIS — M24074 Loose body in right toe joint(s): Secondary | ICD-10-CM | POA: Diagnosis not present

## 2016-08-12 DIAGNOSIS — M109 Gout, unspecified: Secondary | ICD-10-CM

## 2016-08-12 DIAGNOSIS — M25571 Pain in right ankle and joints of right foot: Secondary | ICD-10-CM | POA: Diagnosis not present

## 2016-08-12 DIAGNOSIS — M24071 Loose body in right ankle: Secondary | ICD-10-CM | POA: Diagnosis not present

## 2016-08-12 LAB — CBC WITH DIFFERENTIAL/PLATELET
BASOS ABS: 0 {cells}/uL (ref 0–200)
BASOS PCT: 0 %
Eosinophils Absolute: 261 cells/uL (ref 15–500)
Eosinophils Relative: 3 %
HEMATOCRIT: 48.3 % (ref 38.5–50.0)
Hemoglobin: 16.3 g/dL (ref 13.2–17.1)
LYMPHS PCT: 21 %
Lymphs Abs: 1827 cells/uL (ref 850–3900)
MCH: 30.6 pg (ref 27.0–33.0)
MCHC: 33.7 g/dL (ref 32.0–36.0)
MCV: 90.6 fL (ref 80.0–100.0)
MONO ABS: 870 {cells}/uL (ref 200–950)
MPV: 9.4 fL (ref 7.5–12.5)
Monocytes Relative: 10 %
NEUTROS PCT: 66 %
Neutro Abs: 5742 cells/uL (ref 1500–7800)
Platelets: 314 10*3/uL (ref 140–400)
RBC: 5.33 MIL/uL (ref 4.20–5.80)
RDW: 13.9 % (ref 11.0–15.0)
WBC: 8.7 10*3/uL (ref 3.8–10.8)

## 2016-08-12 LAB — COMPREHENSIVE METABOLIC PANEL
ALK PHOS: 72 U/L (ref 40–115)
ALT: 17 U/L (ref 9–46)
AST: 17 U/L (ref 10–35)
Albumin: 4.2 g/dL (ref 3.6–5.1)
BUN: 13 mg/dL (ref 7–25)
CALCIUM: 9.9 mg/dL (ref 8.6–10.3)
CO2: 28 mmol/L (ref 20–31)
Chloride: 103 mmol/L (ref 98–110)
Creat: 1.2 mg/dL (ref 0.70–1.33)
GLUCOSE: 80 mg/dL (ref 65–99)
POTASSIUM: 4.5 mmol/L (ref 3.5–5.3)
Sodium: 141 mmol/L (ref 135–146)
Total Bilirubin: 1.4 mg/dL — ABNORMAL HIGH (ref 0.2–1.2)
Total Protein: 7.4 g/dL (ref 6.1–8.1)

## 2016-08-12 NOTE — Patient Instructions (Signed)
Get MRI and Labs.

## 2016-08-12 NOTE — Progress Notes (Signed)
Subjective:    Patient ID: Marcus Frazier, male    DOB: Jun 15, 1960, 57 y.o.   MRN: [id number]  HPI He has had intermittent pain and swelling of the right ankle and right foot for some time now.  He usually gets ankle pain first anteriorly and to the lateral side that may last a day or two then the foot pain begins.  He takes naproxen when it happens and the pain and swelling will go away.  He has no trauma, no redness.  Over the last few weeks his pain has continued and the naproxen has not helped.  It is more in the right foot laterally now.  Heat or ice does not help.  He has no numbness.  He has no other joint involvement.  He says gout does not run in the family.   Review of Systems  HENT: Negative for congestion.   Respiratory: Negative for cough and shortness of breath.   Cardiovascular: Negative for chest pain and leg swelling.  Endocrine: Negative for cold intolerance.  Musculoskeletal: Positive for arthralgias, back pain, gait problem and joint swelling.  Allergic/Immunologic: Positive for environmental allergies.   Past Medical History:  Diagnosis Date  . Back pain   . Colon polyps   . Diverticula of colon   . GERD (gastroesophageal reflux disease)   . Herniated disc, cervical   . HTN (hypertension)   . Kidney stones   . Right ankle injury   . Seasonal allergies     Past Surgical History:  Procedure Laterality Date  . CERVICAL FUSION     C4-C6  . COLONOSCOPY  08/20/2004   diverticula  . COLONOSCOPY  05/12/2011   Rourk- normal rectum, sigmoid diverticulosis, remainder of colonic mucosa appeared normal.    Current Outpatient Prescriptions on File Prior to Visit  Medication Sig Dispense Refill  . DIOVAN HCT 160-12.5 MG per tablet Take 0.5-1 tablets by mouth daily. Takes a 0.5 tablet in the summer if its hot outside but usually takes 1 whole tablet    . naproxen (NAPROSYN) 500 MG tablet Take 500 mg by mouth 2 (two) times daily as needed for pain.  10  . clobetasol  ointment (TEMOVATE) 6.28 % Apply 1 application topically 3 (three) times daily as needed (skin irritation).    . Fiber POWD Take 2 scoop by mouth daily.    . polyethylene glycol-electrolytes (TRILYTE) 420 g solution Take 4,000 mLs by mouth as directed. (Patient not taking: Reported on 08/12/2016) 4000 mL 0   No current facility-administered medications on file prior to visit.     Social History   Social History  . Marital status: Married    Spouse name: N/A  . Number of children: N/A  . Years of education: N/A   Occupational History  . Not on file.   Social History Main Topics  . Smoking status: Former Smoker    Packs/day: 1.00    Years: 4.00    Types: Cigarettes  . Smokeless tobacco: Never Used  . Alcohol use No  . Drug use: No  . Sexual activity: Not on file   Other Topics Concern  . Not on file   Social History Narrative  . No narrative on file    Family History  Problem Relation Age of Onset  . Colon cancer Neg Hx     BP 136/84   Pulse 72   Ht 5' 9.5" (1.765 m)   Wt 240 lb (108.9 kg)   BMI 34.93 kg/m  Objective:   Physical Exam  Constitutional: He is oriented to person, place, and time. He appears well-developed and well-nourished.  HENT:  Head: Normocephalic and atraumatic.  Eyes: Conjunctivae and EOM are normal. Pupils are equal, round, and reactive to light.  Neck: Normal range of motion. Neck supple.  Cardiovascular: Normal rate, regular rhythm and intact distal pulses.   Pulmonary/Chest: Effort normal.  Abdominal: Soft.  Musculoskeletal: He exhibits tenderness (Right ankle with just slight lateral tenderness.  ROM is full. He has tenderness at the fourth and fifth metatarsal area more proximally and laterally.  Slight swelling is present of the right foot.  NV intact.  Limp to right.  Left side negative.).  Neurological: He is alert and oriented to person, place, and time. He has normal reflexes. He displays normal reflexes. No cranial nerve  deficit. He exhibits normal muscle tone. Coordination normal.  Skin: Skin is warm and dry.  Psychiatric: He has a normal mood and affect. His behavior is normal. Judgment and thought content normal.  Vitals reviewed.   X-rays were done of the right foot and ankle, reported separately. He has lytic area of the distal tibial laterally with some sclerosis also present.  MRI recommended for this.      Assessment & Plan:   Encounter Diagnoses  Name Primary?  . Pain in joint involving right ankle and foot Yes  . Gout of right ankle, unspecified cause, unspecified chronicity    I would like to get a serum uric acid to rule out gout.  I would like to get a Chem-12 panel and CBC with diff.  I would like to get a MRI of the right ankle with and without contrast.  I have shown him the x-rays and explained the need for the MRI.  Return after the MRI and labs.  Precautions discussed.  Call if any problem.  Electronically Signed Sanjuana Kava, MD 6/5/20189:51 AM

## 2016-08-13 ENCOUNTER — Ambulatory Visit (HOSPITAL_COMMUNITY)
Admission: RE | Admit: 2016-08-13 | Discharge: 2016-08-13 | Disposition: A | Discharge status: Home / Self Care | Payer: BLUE CROSS/BLUE SHIELD | Source: Ambulatory Visit | Attending: Orthopaedic Surgery | Admitting: Orthopaedic Surgery

## 2016-08-13 DIAGNOSIS — M25571 Pain in right ankle and joints of right foot: Secondary | ICD-10-CM | POA: Diagnosis not present

## 2016-08-13 DIAGNOSIS — M24071 Loose body in right ankle: Secondary | ICD-10-CM

## 2016-08-13 DIAGNOSIS — M898X7 Other specified disorders of bone, ankle and foot: Secondary | ICD-10-CM | POA: Insufficient documentation

## 2016-08-13 DIAGNOSIS — M25471 Effusion, right ankle: Secondary | ICD-10-CM | POA: Diagnosis not present

## 2016-08-13 DIAGNOSIS — M659 Synovitis and tenosynovitis, unspecified: Secondary | ICD-10-CM | POA: Diagnosis not present

## 2016-08-13 DIAGNOSIS — M8560 Other cyst of bone, unspecified site: Secondary | ICD-10-CM

## 2016-08-13 DIAGNOSIS — M24074 Loose body in right toe joint(s): Secondary | ICD-10-CM

## 2016-08-13 LAB — URIC ACID: Uric Acid, Serum: 7.4 mg/dL (ref 4.0–8.0)

## 2016-08-13 MED ORDER — GADOBENATE DIMEGLUMINE 529 MG/ML IV SOLN
20.0000 mL | Freq: Once | INTRAVENOUS | Status: AC | PRN
  Administered 2016-08-13: 20 mL via INTRAVENOUS

## 2016-08-14 ENCOUNTER — Encounter: Payer: Self-pay | Admitting: Orthopaedic Surgery

## 2016-08-14 ENCOUNTER — Ambulatory Visit (INDEPENDENT_AMBULATORY_CARE_PROVIDER_SITE_OTHER): Payer: BLUE CROSS/BLUE SHIELD | Admitting: Orthopaedic Surgery

## 2016-08-14 VITALS — BP 136/85 | HR 58 | Temp 98.1°F | Ht 69.5 in | Wt 243.0 lb

## 2016-08-14 DIAGNOSIS — M8560 Other cyst of bone, unspecified site: Secondary | ICD-10-CM

## 2016-08-14 DIAGNOSIS — M109 Gout, unspecified: Secondary | ICD-10-CM | POA: Diagnosis not present

## 2016-08-14 DIAGNOSIS — M25571 Pain in right ankle and joints of right foot: Secondary | ICD-10-CM | POA: Diagnosis not present

## 2016-08-14 MED ORDER — ALLOPURINOL 300 MG PO TABS: 300.0000 mg | ORAL_TABLET | Freq: Every day | ORAL | 5 refills | Status: DC

## 2016-08-14 NOTE — Progress Notes (Signed)
Patient Marcus Frazier Marcus Frazier, male DOB:[birth year], 56 y.o. HKV:[id number]  Chief Complaint  Patient presents with  . Follow-up    right ankle mri review    HPI  Marcus Frazier is a 56 y.o. male who has pain of the right ankle.  He had MRI done and it showed: IMPRESSION 1. Small ankle joint effusion with severe focal synovitis of the anterior medial ankle joint with subcortical reactive marrow edema in the anteromedial talus. 2. Mild marrow edema at the base of the fifth metatarsal likely reflecting mild stress reaction. 3. Mild cortical regularity along the distal lateral margin of the tibial metaphysis likely reflecting sequela prior syndesmotic injury without other aggressive abnormality.  I have explained the findings to him.  I will have him seen at [facility] for further evaluation of this.  He is agreeable to this.  His serum uric acid was 7.4 at the borderline for elevation.  I have given samples of Uloric 40 one daily and have called in regular Rx for allopurinol 300 one daily from now on.  The rest of his labs, Chem 21 and CBC were normal.  HPI  Body mass index is 35.37 kg/m.  ROS  Review of Systems  HENT: Negative for congestion.   Respiratory: Negative for cough and shortness of breath.   Cardiovascular: Negative for chest pain and leg swelling.  Endocrine: Negative for cold intolerance.  Musculoskeletal: Positive for arthralgias, back pain, gait problem and joint swelling.  Allergic/Immunologic: Positive for environmental allergies.    Past Medical History:  Diagnosis Date  . Back pain   . Colon polyps   . Diverticula of colon   . GERD (gastroesophageal reflux disease)   . Herniated disc, cervical   . HTN (hypertension)   . Kidney stones   . Right ankle injury   . Seasonal allergies     Past Surgical History:  Procedure Laterality Date  . CERVICAL FUSION     C4-C6  . COLONOSCOPY  08/20/2004   diverticula  . COLONOSCOPY  05/12/2011   Rourk-  normal rectum, sigmoid diverticulosis, remainder of colonic mucosa appeared normal.    Family History  Problem Relation Age of Onset  . Colon cancer Neg Hx     Social History Social History  Substance Use Topics  . Smoking status: Former Smoker    Packs/day: 1.00    Years: 4.00    Types: Cigarettes  . Smokeless tobacco: Never Used  . Alcohol use No    No Known Allergies  Current Outpatient Prescriptions  Medication Sig Dispense Refill  . allopurinol (ZYLOPRIM) 300 MG tablet Take 1 tablet (300 mg total) by mouth daily. 30 tablet 5  . clobetasol ointment (TEMOVATE) 5.64 % Apply 1 application topically 3 (three) times daily as needed (skin irritation).    . DIOVAN HCT 160-12.5 MG per tablet Take 0.5-1 tablets by mouth daily. Takes a 0.5 tablet in the summer if its hot outside but usually takes 1 whole tablet    . Fiber POWD Take 2 scoop by mouth daily.    . naproxen (NAPROSYN) 500 MG tablet Take 500 mg by mouth 2 (two) times daily as needed for pain.  10  . pantoprazole (PROTONIX) 40 MG tablet   2  . polyethylene glycol-electrolytes (TRILYTE) 420 g solution Take 4,000 mLs by mouth as directed. (Patient not taking: Reported on 08/12/2016) 4000 mL 0   No current facility-administered medications for this visit.      Physical Exam  Blood pressure  136/85, pulse (!) 58, temperature 98.1 F (36.7 C), height 5' 9.5" (1.765 m), weight 243 lb (110.2 kg).  Constitutional: overall normal hygiene, normal nutrition, well developed, normal grooming, normal body habitus. Assistive device:none  Musculoskeletal: gait and station Limp none, muscle tone and strength are normal, no tremors or atrophy is present.  .  Neurological: coordination overall normal.  Deep tendon reflex/nerve stretch intact.  Sensation normal.  Cranial nerves II-XII intact.   Skin:   Normal overall no scars, lesions, ulcers or rashes. No psoriasis.  Psychiatric: Alert and oriented x 3.  Recent memory intact, remote  memory unclear.  Normal mood and affect. Well groomed.  Good eye contact.  Cardiovascular: overall no swelling, no varicosities, no edema bilaterally, normal temperatures of the legs and arms, no clubbing, cyanosis and good capillary refill.  Lymphatic: palpation is normal.  He has no limp today.  He has less swelling of the right foot and ankle and little tenderness.  NV intact. ROM of the ankle is full.  The patient has been educated about the nature of the problem(s) and counseled on treatment options.  The patient appeared to understand what I have discussed and is in agreement with it.  Encounter Diagnoses  Name Primary?  . Pain in joint involving right ankle and foot Yes  . Gout of right ankle, unspecified cause, unspecified chronicity   . Bone cyst     PLAN Call if any problems.  Precautions discussed.  Continue current medications.   Return to clinic 1 month   To [place] serum uric acid on return here.  Electronically Signed Sanjuana Kava, MD 6/7/201810:39 AM

## 2016-08-14 NOTE — Patient Instructions (Signed)
To [place] in about a month for serum uric acid.

## 2016-08-15 ENCOUNTER — Encounter: Payer: Self-pay | Admitting: Internal Medicine

## 2016-08-18 ENCOUNTER — Encounter (HOSPITAL_COMMUNITY): Payer: Self-pay | Admitting: Internal Medicine

## 2016-09-02 DIAGNOSIS — S93401A Sprain of unspecified ligament of right ankle, initial encounter: Secondary | ICD-10-CM | POA: Insufficient documentation

## 2016-09-02 DIAGNOSIS — S93402A Sprain of unspecified ligament of left ankle, initial encounter: Secondary | ICD-10-CM | POA: Insufficient documentation

## 2016-09-06 ENCOUNTER — Emergency Department (HOSPITAL_COMMUNITY)
Admission: EM | Admit: 2016-09-06 | Discharge: 2016-09-06 | Disposition: A | Discharge status: Home / Self Care | Payer: BLUE CROSS/BLUE SHIELD | Attending: Emergency Medicine | Admitting: Emergency Medicine

## 2016-09-06 ENCOUNTER — Emergency Department (HOSPITAL_COMMUNITY): Payer: BLUE CROSS/BLUE SHIELD

## 2016-09-06 DIAGNOSIS — R0602 Shortness of breath: Secondary | ICD-10-CM | POA: Diagnosis not present

## 2016-09-06 DIAGNOSIS — Z79899 Other long term (current) drug therapy: Secondary | ICD-10-CM | POA: Insufficient documentation

## 2016-09-06 DIAGNOSIS — R509 Fever, unspecified: Secondary | ICD-10-CM | POA: Insufficient documentation

## 2016-09-06 LAB — CBC WITH DIFFERENTIAL/PLATELET
BASOS PCT: 0 %
Basophils Absolute: 0 10*3/uL (ref 0.0–0.1)
EOS ABS: 0.3 10*3/uL (ref 0.0–0.7)
Eosinophils Relative: 2 %
HCT: 45 % (ref 39.0–52.0)
Hemoglobin: 15.4 g/dL (ref 13.0–17.0)
Lymphocytes Relative: 7 %
Lymphs Abs: 1.1 10*3/uL (ref 0.7–4.0)
MCH: 30.3 pg (ref 26.0–34.0)
MCHC: 34.2 g/dL (ref 30.0–36.0)
MCV: 88.4 fL (ref 78.0–100.0)
MONO ABS: 0.2 10*3/uL (ref 0.1–1.0)
MONOS PCT: 2 %
NEUTROS PCT: 89 %
Neutro Abs: 13.1 10*3/uL — ABNORMAL HIGH (ref 1.7–7.7)
PLATELETS: 217 10*3/uL (ref 150–400)
RBC: 5.09 MIL/uL (ref 4.22–5.81)
RDW: 13.9 % (ref 11.5–15.5)
WBC: 14.7 10*3/uL — ABNORMAL HIGH (ref 4.0–10.5)

## 2016-09-06 LAB — URINALYSIS, ROUTINE W REFLEX MICROSCOPIC
Bilirubin Urine: NEGATIVE
Glucose, UA: NEGATIVE mg/dL
Hgb urine dipstick: NEGATIVE
KETONES UR: NEGATIVE mg/dL
LEUKOCYTES UA: NEGATIVE
NITRITE: NEGATIVE
PH: 5 (ref 5.0–8.0)
Protein, ur: NEGATIVE mg/dL
SPECIFIC GRAVITY, URINE: 1.009 (ref 1.005–1.030)

## 2016-09-06 LAB — COMPREHENSIVE METABOLIC PANEL
ALBUMIN: 4.4 g/dL (ref 3.5–5.0)
ALT: 24 U/L (ref 17–63)
ANION GAP: 10 (ref 5–15)
AST: 22 U/L (ref 15–41)
Alkaline Phosphatase: 86 U/L (ref 38–126)
BUN: 16 mg/dL (ref 6–20)
CO2: 27 mmol/L (ref 22–32)
Calcium: 9.5 mg/dL (ref 8.9–10.3)
Chloride: 104 mmol/L (ref 101–111)
Creatinine, Ser: 1.43 mg/dL — ABNORMAL HIGH (ref 0.61–1.24)
GFR calc Af Amer: >60 mL/min (ref >60)
GFR calc non Af Amer: 54 mL/min — ABNORMAL LOW (ref >60)
GLUCOSE: 96 mg/dL (ref 65–99)
POTASSIUM: 3.9 mmol/L (ref 3.5–5.1)
SODIUM: 141 mmol/L (ref 135–145)
TOTAL PROTEIN: 7.8 g/dL (ref 6.5–8.1)
Total Bilirubin: 1.4 mg/dL — ABNORMAL HIGH (ref 0.3–1.2)

## 2016-09-06 LAB — I-STAT CG4 LACTIC ACID, ED: LACTIC ACID, VENOUS: 1.11 mmol/L (ref 0.5–1.9)

## 2016-09-06 LAB — LACTIC ACID, PLASMA: LACTIC ACID, VENOUS: 1.1 mmol/L (ref 0.5–1.9)

## 2016-09-06 LAB — LIPASE, BLOOD: Lipase: 31 U/L (ref 11–51)

## 2016-09-06 MED ORDER — SODIUM CHLORIDE 0.9 % IV BOLUS (SEPSIS)
2000.0000 mL | Freq: Once | INTRAVENOUS | Status: AC
  Administered 2016-09-06: 2000 mL via INTRAVENOUS

## 2016-09-06 MED ORDER — ALBUTEROL SULFATE (2.5 MG/3ML) 0.083% IN NEBU
5.0000 mg | INHALATION_SOLUTION | Freq: Once | RESPIRATORY_TRACT | Status: AC
  Administered 2016-09-06: 5 mg via RESPIRATORY_TRACT
  Filled 2016-09-06: qty 6

## 2016-09-06 MED ORDER — PIPERACILLIN-TAZOBACTAM 3.375 G IVPB 30 MIN
3.3750 g | Freq: Once | INTRAVENOUS | Status: AC
  Administered 2016-09-06: 3.375 g via INTRAVENOUS
  Filled 2016-09-06: qty 50

## 2016-09-06 MED ORDER — VANCOMYCIN HCL IN DEXTROSE 1-5 GM/200ML-% IV SOLN
1000.0000 mg | Freq: Once | INTRAVENOUS | Status: AC
  Administered 2016-09-06: 1000 mg via INTRAVENOUS
  Filled 2016-09-06: qty 200

## 2016-09-06 MED ORDER — SODIUM CHLORIDE 0.9 % IV BOLUS (SEPSIS)
1000.0000 mL | Freq: Once | INTRAVENOUS | Status: AC
  Administered 2016-09-06: 1000 mL via INTRAVENOUS

## 2016-09-06 MED ORDER — LEVOFLOXACIN 500 MG PO TABS: 500.0000 mg | ORAL_TABLET | Freq: Every day | ORAL | 0 refills | Status: DC

## 2016-09-06 NOTE — ED Notes (Addendum)
Blood cultures acquired prior to antibiotic administration.

## 2016-09-06 NOTE — ED Triage Notes (Signed)
Mowed yard  [place] into home, 1 1/2 hour later reports fever and shortness of breath

## 2016-09-06 NOTE — Discharge Instructions (Signed)
Follow up with your md for recheck.   Contact your stomach doctor about the blood in your stool

## 2016-09-06 NOTE — ED Provider Notes (Addendum)
[facility] DEPT Provider Note   CSN: [id number] Arrival date & time: 09/06/16  1346     History   Chief Complaint Chief Complaint  Patient presents with  . Shortness of Breath  . Fever    HPI Marcus Frazier is a 56 y.o. male.  Patient presented with fever chills and some shortness of breath   The history is provided by the patient. No language interpreter was used.  Shortness of Breath  This is a new problem. The problem occurs continuously.The current episode started 3 to 5 hours ago. The problem has not changed since onset.Associated symptoms include a fever. Pertinent negatives include no headaches, no cough, no chest pain, no abdominal pain and no rash. It is unknown what precipitated the problem. Risk factors: None. He has tried nothing for the symptoms. The treatment provided no relief. He has had no prior hospitalizations. He has had no prior ED visits. He has had no prior ICU admissions. Associated medical issues include asthma.  Fever   Pertinent negatives include no chest pain, no diarrhea, no congestion, no headaches and no cough.    Past Medical History:  Diagnosis Date  . Back pain   . Colon polyps   . Diverticula of colon   . GERD (gastroesophageal reflux disease)   . Herniated disc, cervical   . HTN (hypertension)   . Kidney stones   . Right ankle injury   . Seasonal allergies     Patient Active Problem List   Diagnosis Date Noted  . Dysphagia 07/11/2016  . Chronic LLQ pain 07/11/2016  . Hx of adenomatous colonic polyps 07/11/2016  . Unspecified constipation 01/18/2013  . Anorectal pain 01/18/2013  . Rectal bleeding 01/18/2013  . Colon polyp 04/22/2011    Past Surgical History:  Procedure Laterality Date  . CERVICAL FUSION     C4-C6  . COLONOSCOPY  08/20/2004   diverticula  . COLONOSCOPY  05/12/2011   Rourk- normal rectum, sigmoid diverticulosis, remainder of colonic mucosa appeared normal.  . COLONOSCOPY N/A 08/08/2016   Procedure:  COLONOSCOPY;  Surgeon: Daneil Dolin, MD;  Location: [facility]ITE;  Service: Endoscopy;  Laterality: N/A;  1030   . ESOPHAGOGASTRODUODENOSCOPY N/A 08/08/2016   Procedure: ESOPHAGOGASTRODUODENOSCOPY (EGD);  Surgeon: Daneil Dolin, MD;  Location: [facility] SU[facility];  Service: Endoscopy;  Laterality: N/A;  . Venia Minks DILATION N/A 08/08/2016   Procedure: Venia Minks DILATION;  Surgeon: Daneil Dolin, MD;  Location: [facility];  Service: Endoscopy;  Laterality: N/A;       Home Medications    Prior to Admission medications   Medication Sig Start Date End Date Taking? Authorizing Provider  acetaminophen (TYLENOL) 500 MG tablet Take 1,000 mg by mouth every 6 (six) hours as needed for mild pain, moderate pain or fever.   Yes [provider]  allopurinol (ZYLOPRIM) 300 MG tablet Take 1 tablet (300 mg total) by mouth daily. 08/14/16  Yes Sanjuana Kava, MD  clobetasol ointment (TEMOVATE) 0.27 % Apply 1 application topically 3 (three) times daily as needed (skin irritation).   Yes [provider]  Fiber POWD Take 2 scoop by mouth daily.   Yes [provider]  ibuprofen (ADVIL,MOTRIN) 200 MG tablet Take 200 mg by mouth every 6 (six) hours as needed for headache, mild pain or moderate pain.   Yes [provider]  naproxen (NAPROSYN) 500 MG tablet Take 500 mg by mouth 2 (two) times daily as needed for pain. 07/08/16  Yes [provider]  pantoprazole (PROTONIX) 40 MG tablet Take 40 mg by mouth 2 (two) times daily.  08/08/16  Yes [provider]  valsartan-hydrochlorothiazide (DIOVAN HCT) 160-12.5 MG tablet Take 0.5-1 tablets by mouth daily. Takes a 0.5 tablet in the summer if its hot outside but usually takes 1 whole tablet 04/16/11  Yes [provider]  levofloxacin (LEVAQUIN) 500 MG tablet Take 1 tablet (500 mg total) by mouth daily. 09/06/16   Milton Ferguson, MD  polyethylene glycol-electrolytes (TRILYTE) 420 g solution Take 4,000 mLs by mouth as  directed. Patient not taking: Reported on 08/12/2016 07/11/16   Rourk, Cristopher Estimable, MD    Family History Family History  Problem Relation Age of Onset  . Colon cancer Neg Hx     Social History Social History  Substance Use Topics  . Smoking status: Former Smoker    Packs/day: 1.00    Years: 4.00    Types: Cigarettes  . Smokeless tobacco: Never Used  . Alcohol use No     Allergies   Patient has no known allergies.   Review of Systems Review of Systems  Constitutional: Positive for fever. Negative for appetite change and fatigue.  HENT: Negative for congestion, ear discharge and sinus pressure.   Eyes: Negative for discharge.  Respiratory: Positive for shortness of breath. Negative for cough.   Cardiovascular: Negative for chest pain.  Gastrointestinal: Negative for abdominal pain and diarrhea.  Genitourinary: Negative for frequency and hematuria.  Musculoskeletal: Negative for back pain.  Skin: Negative for rash.  Neurological: Negative for seizures and headaches.  Psychiatric/Behavioral: Negative for hallucinations.     Physical Exam Updated Vital Signs BP 120/81   Pulse 97   Temp 98.2 F (36.8 C) (Oral)   Resp (!) 21   Ht 5\' 10"  (1.778 m)   Wt 111.1 kg (245 lb)   SpO2 98%   BMI 35.15 kg/m   Physical Exam  Constitutional: He is oriented to person, place, and time. He appears well-developed.  HENT:  Head: Normocephalic.  Eyes: Conjunctivae and EOM are normal. No scleral icterus.  Neck: Neck supple. No thyromegaly present.  Cardiovascular: Normal rate and regular rhythm.  Exam reveals no gallop and no friction rub.   No murmur heard. Pulmonary/Chest: No stridor. He has no wheezes. He has no rales. He exhibits no tenderness.  Abdominal: He exhibits no distension. There is no tenderness. There is no rebound.  Genitourinary: Rectal exam shows guaiac positive stool.  Genitourinary Comments: Rectal exam nontender prostate but bloody exam  Musculoskeletal: Normal  range of motion. He exhibits no edema.  Lymphadenopathy:    He has no cervical adenopathy.  Neurological: He is oriented to person, place, and time. He exhibits normal muscle tone. Coordination normal.  Skin: No rash noted. No erythema.  Psychiatric: He has a normal mood and affect. His behavior is normal.     ED Treatments / Results  Labs (all labs ordered are listed, but only abnormal results are displayed) Labs Reviewed  CBC WITH DIFFERENTIAL/PLATELET - Abnormal; Notable for the following:       Result Value   WBC 14.7 (*)    Neutro Abs 13.1 (*)    All other components within normal limits  COMPREHENSIVE METABOLIC PANEL - Abnormal; Notable for the following:    Creatinine, Ser 1.43 (*)    Total Bilirubin 1.4 (*)    GFR calc non Af Amer 54 (*)    All other components within normal limits  URINALYSIS, ROUTINE W REFLEX MICROSCOPIC - Abnormal;  Notable for the following:    Color, Urine STRAW (*)    All other components within normal limits  CULTURE, BLOOD (ROUTINE X 2)  CULTURE, BLOOD (ROUTINE X 2)  URINE CULTURE  LIPASE, BLOOD  LACTIC ACID, PLASMA  I-STAT CG4 LACTIC ACID, ED  I-STAT CG4 LACTIC ACID, ED    EKG  EKG Interpretation  Date/Time:  Saturday September 06 2016 15:13:14 EDT Ventricular Rate:  118 PR Interval:    QRS Duration: 84 QT Interval:  322 QTC Calculation: 452 R Axis:   71 Text Interpretation:  Sinus tachycardia Borderline T wave abnormalities Baseline wander in lead(s) V5 Confirmed by Milton Ferguson [phone]) on 09/06/2016 3:53:40 PM       Radiology Dg Chest 2 View  Result Date: 09/06/2016 CLINICAL DATA:  56 year old male with sudden onset shortness of breath. Fever. Code sepsis. EXAM: CHEST  2 VIEW COMPARISON:  03/08/2007 chest radiographs. FINDINGS: Lower lung volumes. Mediastinal contours are stable including mild tortuosity of the aortic arch. No pneumothorax, pulmonary edema, pleural effusion or confluent pulmonary opacity. No acute osseous abnormality  identified. Prior cervical ACDF. Negative visible bowel gas pattern. IMPRESSION: Low lung volumes, otherwise no acute cardiopulmonary abnormality. Electronically Signed   By: Genevie Ann M.D.   On: 09/06/2016 15:24    Procedures Procedures (including critical care time)  Medications Ordered in ED Medications  albuterol (PROVENTIL) (2.5 MG/3ML) 0.083% nebulizer solution 5 mg (5 mg Nebulization Given 09/06/16 1414)  sodium chloride 0.9 % bolus 2,000 mL (0 mLs Intravenous Stopped 09/06/16 1640)  sodium chloride 0.9 % bolus 1,000 mL (0 mLs Intravenous Stopped 09/06/16 1741)  vancomycin (VANCOCIN) IVPB 1000 mg/200 mL premix (0 mg Intravenous Stopped 09/06/16 1627)  piperacillin-tazobactam (ZOSYN) IVPB 3.375 g (0 g Intravenous Stopped 09/06/16 1555)     Initial Impression / Assessment and Plan / ED Course  I have reviewed the triage vital signs and the nursing notes.  Pertinent labs & imaging results that were available during my care of the patient were reviewed by me and considered in my medical decision making (see chart for details).     With unremarkable tests. Patient has had febrile illness he has had blood cultures urine culture she will be sent home empirically on Levaquin and will follow-up with his PCP. He'll also discuss the blood in his stool with his GI doctor. He is recently had a and colonoscopy  Final Clinical Impressions(s) / ED Diagnoses   Final diagnoses:  Febrile illness, acute    New Prescriptions New Prescriptions   LEVOFLOXACIN (LEVAQUIN) 500 MG TABLET    Take 1 tablet (500 mg total) by mouth daily.     Milton Ferguson, MD 09/06/16 1800    Milton Ferguson, MD 09/06/16 [phone]

## 2016-09-08 LAB — URINE CULTURE: CULTURE: NO GROWTH

## 2016-09-11 LAB — CULTURE, BLOOD (ROUTINE X 2)
Culture: NO GROWTH
Culture: NO GROWTH
Special Requests: ADEQUATE
Special Requests: ADEQUATE

## 2016-09-12 ENCOUNTER — Other Ambulatory Visit: Payer: Self-pay | Admitting: Orthopaedic Surgery

## 2016-09-13 LAB — URIC ACID: URIC ACID, SERUM: 6 mg/dL (ref 4.0–8.0)

## 2016-09-16 ENCOUNTER — Ambulatory Visit (INDEPENDENT_AMBULATORY_CARE_PROVIDER_SITE_OTHER): Payer: Commercial Managed Care - PPO | Admitting: Orthopaedic Surgery

## 2016-09-16 ENCOUNTER — Encounter: Payer: Self-pay | Admitting: Orthopaedic Surgery

## 2016-09-16 VITALS — BP 131/82 | HR 92 | Temp 97.9°F | Ht 69.5 in | Wt 241.0 lb

## 2016-09-16 DIAGNOSIS — M25572 Pain in left ankle and joints of left foot: Secondary | ICD-10-CM | POA: Diagnosis not present

## 2016-09-16 DIAGNOSIS — M109 Gout, unspecified: Secondary | ICD-10-CM

## 2016-09-16 DIAGNOSIS — M25571 Pain in right ankle and joints of right foot: Secondary | ICD-10-CM

## 2016-09-16 MED ORDER — PREDNISONE 5 MG (21) PO TBPK: ORAL_TABLET | ORAL | 0 refills | Status: DC

## 2016-09-16 MED ORDER — COLCHICINE 0.6 MG PO TABS: ORAL_TABLET | ORAL | 3 refills | Status: DC

## 2016-09-16 MED ORDER — IBUPROFEN 800 MG PO TABS: 800.0000 mg | ORAL_TABLET | Freq: Three times a day (TID) | ORAL | 5 refills | Status: DC | PRN

## 2016-09-16 NOTE — Progress Notes (Signed)
Patient DT:OIZTIW Marcus Frazier, male DOB:12-May-1960, 56 y.o. PYK:[id number]  Chief Complaint  Patient presents with  . Follow-up    bilateral ankle pain    HPI  Marcus Frazier is a 56 y.o. male who has gout of the right ankle and foot.  He was seen at [facility] for his right ankle abnormality on the MRI and was told no surgery was needed and just to watch it according to him.  I have not received any notes.  He has had some swelling of the left ankle.  His uric acid done yesterday was 6, down from 7.4 last month.  He is taking his allopurinol.  I will give colchicine and prednisone for the left ankle. HPI  Body mass index is 35.08 kg/m.  ROS  Review of Systems  HENT: Negative for congestion.   Respiratory: Negative for cough and shortness of breath.   Cardiovascular: Negative for chest pain and leg swelling.  Endocrine: Negative for cold intolerance.  Musculoskeletal: Positive for arthralgias, back pain, gait problem and joint swelling.  Allergic/Immunologic: Positive for environmental allergies.    Past Medical History:  Diagnosis Date  . Back pain   . Colon polyps   . Diverticula of colon   . GERD (gastroesophageal reflux disease)   . Herniated disc, cervical   . HTN (hypertension)   . Kidney stones   . Right ankle injury   . Seasonal allergies     Past Surgical History:  Procedure Laterality Date  . CERVICAL FUSION     C4-C6  . COLONOSCOPY  08/20/2004   diverticula  . COLONOSCOPY  05/12/2011   Rourk- normal rectum, sigmoid diverticulosis, remainder of colonic mucosa appeared normal.  . COLONOSCOPY N/A 08/08/2016   Procedure: COLONOSCOPY;  Surgeon: Daneil Dolin, MD;  [facility]: [facility];  Service: Endoscopy;  Laterality: N/A;  1030   . ESOPHAGOGASTRODUODENOSCOPY N/A 08/08/2016   Procedure: ESOPHAGOGASTRODUODENOSCOPY (EGD);  Surgeon: Daneil Dolin, MD;  Location: [facility];  Service: Endoscopy;  Laterality: N/A;  . Venia Minks DILATION N/A 08/08/2016    Procedure: Venia Minks DILATION;  Surgeon: Daneil Dolin, MD;  Location: [facility];  Service: Endoscopy;  Laterality: N/A;    Family History  Problem Relation Age of Onset  . Colon cancer Neg Hx     Social History Social History  Substance Use Topics  . Smoking status: Former Smoker    Packs/day: 1.00    Years: 4.00    Types: Cigarettes  . Smokeless tobacco: Never Used  . Alcohol use No    No Known Allergies  Current Outpatient Prescriptions  Medication Sig Dispense Refill  . acetaminophen (TYLENOL) 500 MG tablet Take 1,000 mg by mouth every 6 (six) hours as needed for mild pain, moderate pain or fever.    Marland Kitchen allopurinol (ZYLOPRIM) 300 MG tablet Take 1 tablet (300 mg total) by mouth daily. 30 tablet 5  . clobetasol ointment (TEMOVATE) 5.39 % Apply 1 application topically 3 (three) times daily as needed (skin irritation).    . colchicine 0.6 MG tablet One by mouth three times a day for five days for gout pain. 15 tablet 3  . Fiber POWD Take 2 scoop by mouth daily.    Marland Kitchen ibuprofen (ADVIL,MOTRIN) 800 MG tablet Take 1 tablet (800 mg total) by mouth every 8 (eight) hours as needed. 90 tablet 5  . levofloxacin (LEVAQUIN) 500 MG tablet Take 1 tablet (500 mg total) by mouth daily. 7 tablet 0  . pantoprazole (  PROTONIX) 40 MG tablet Take 40 mg by mouth 2 (two) times daily.   2  . polyethylene glycol-electrolytes (TRILYTE) 420 g solution Take 4,000 mLs by mouth as directed. (Patient not taking: Reported on 08/12/2016) 4000 mL 0  . predniSONE (STERAPRED UNI-PAK 21 TAB) 5 MG (21) TBPK tablet Take 6 pills first day; 5 pills second day; 4 pills third day; 3 pills fourth day; 2 pills next day and 1 pill last day. 21 tablet 0  . valsartan-hydrochlorothiazide (DIOVAN HCT) 160-12.5 MG tablet Take 0.5-1 tablets by mouth daily. Takes a 0.5 tablet in the summer if its hot outside but usually takes 1 whole tablet     No current facility-administered medications for this visit.      Physical  Exam  Blood pressure 131/82, pulse 92, temperature 97.9 F (36.6 C), height 5' 9.5" (1.765 m), weight 241 lb (109.3 kg).  Constitutional: overall normal hygiene, normal nutrition, well developed, normal grooming, normal body habitus. Assistive device:none  Musculoskeletal: gait and station Limp left, muscle tone and strength are normal, no tremors or atrophy is present.  .  Neurological: coordination overall normal.  Deep tendon reflex/nerve stretch intact.  Sensation normal.  Cranial nerves II-XII intact.   Skin:   Normal overall no scars, lesions, ulcers or rashes. No psoriasis.  Psychiatric: Alert and oriented x 3.  Recent memory intact, remote memory unclear.  Normal mood and affect. Well groomed.  Good eye contact.  Cardiovascular: overall no swelling, no varicosities, no edema bilaterally, normal temperatures of the legs and arms, no clubbing, cyanosis and good capillary refill.  Lymphatic: palpation is normal.  Right foot and ankle are not tender or swollen.  Left ankle tender laterally with slight swelling, no redness.  NV intact.   The patient has been educated about the nature of the problem(s) and counseled on treatment options.  The patient appeared to understand what I have discussed and is in agreement with it.  Encounter Diagnoses  Name Primary?  . Pain in joint involving right ankle and foot Yes  . Gout of right ankle, unspecified cause, unspecified chronicity   . Acute left ankle pain     PLAN Call if any problems.  Precautions discussed.  Continue current medications.   Return to clinic prn   Electronically Signed Sanjuana Kava, MD 7/10/201810:39 AM

## 2016-09-17 ENCOUNTER — Ambulatory Visit (INDEPENDENT_AMBULATORY_CARE_PROVIDER_SITE_OTHER): Payer: Commercial Managed Care - PPO | Admitting: Family Medicine

## 2016-09-17 ENCOUNTER — Encounter: Payer: Self-pay | Admitting: Family Medicine

## 2016-09-17 VITALS — BP 124/80 | Temp 98.7°F | Ht 70.0 in | Wt 242.0 lb

## 2016-09-17 DIAGNOSIS — I10 Essential (primary) hypertension: Secondary | ICD-10-CM | POA: Diagnosis not present

## 2016-09-17 DIAGNOSIS — K2100 Gastro-esophageal reflux disease with esophagitis, without bleeding: Secondary | ICD-10-CM

## 2016-09-17 MED ORDER — VALSARTAN-HYDROCHLOROTHIAZIDE 160-12.5 MG PO TABS: 0.5000 | ORAL_TABLET | Freq: Every day | ORAL | 1 refills | Status: DC

## 2016-09-17 MED ORDER — PANTOPRAZOLE SODIUM 40 MG PO TBEC: 40.0000 mg | DELAYED_RELEASE_TABLET | Freq: Two times a day (BID) | ORAL | 1 refills | Status: DC

## 2016-09-17 NOTE — Progress Notes (Signed)
   Subjective:    Patient ID: Marcus Frazier, male    DOB: June 05, 1960, 56 y.o.   MRN: [id number]  HPIFollow up ED visit for fever and shortness of breath. Pt states he is feeling better. Finished levaquin.   Recent febrie illness, presentrd to ER with fever and sob, got to deling better Full emergency room report reviewed in presence of patient. All lab tests and results also reviewed.  Patient did have heme positive stools and was advised follow-up with primary care. Transient bright red blood, after rect exam, neg colonoscopy just one mo ago . Patient gives history of intermittent bright red blood with constipation. Negative colonoscopy in recent months.  Hx of constip, controls  Blood pressure medicine and blood pressure levels reviewed today with patient. Compliant with blood pressure medicine. States does not miss a dose. No obvious side effects. Blood pressure generally good when checked elsewhere. Watching salt intake.   Pos hx of htn  Quit smoking 33 yrs ago  Pt works as Corporate treasurer, Dietitian, Has persistent symptoms   Followed by [facility] in past, no partic doctor,    Handling valsartan well (diovan hctz)    Review of Systems No headache, no major weight loss or weight gain, no chest pain no back pain abdominal pain no change in bowel habits complete ROS otherwise negative     Objective:   Physical Exam  130/78  Alert and oriented, vitals reviewed and stable, NAD ENT-TM's and ext canals WNL bilat via otoscopic exam Soft palate, tonsils and post pharynx WNL via oropharyngeal exam Neck-symmetric, no masses; thyroid nonpalpable and nontender Pulmonary-no tachypnea or accessory muscle use; Clear without wheezes via auscultation Card--no abnrml murmurs, rhythm reg and rate WNL Carotid pulses symmetric, without bruits     Assessment & Plan:  Impression 1 hypertension good control discussed maintain same meds #2 febrile illness etiology unclear  management reasonable. We'll hold off on further workup discussed #3 heme positive stool with bright red blood per rectum. And negative colonoscopy this year. No need for further workup and less persists discussed old labs/old records requested. Follow-up in 6 months. Recommend every 6 month visits for chronic concerns. Medications refilled

## 2016-09-18 DIAGNOSIS — K2100 Gastro-esophageal reflux disease with esophagitis, without bleeding: Secondary | ICD-10-CM | POA: Insufficient documentation

## 2016-09-18 DIAGNOSIS — I10 Essential (primary) hypertension: Secondary | ICD-10-CM | POA: Insufficient documentation

## 2016-09-24 ENCOUNTER — Telehealth: Payer: Self-pay | Admitting: *Deleted

## 2016-09-24 NOTE — Telephone Encounter (Signed)
Fax from [employer] [facility]. Pantoprazole sodium was denied. Plan only covers one tablet per day. See form for explanation. Form in folder in dr steve's office

## 2016-10-03 MED ORDER — PANTOPRAZOLE SODIUM 40 MG PO TBEC: DELAYED_RELEASE_TABLET | ORAL | 1 refills | Status: DC

## 2016-10-03 NOTE — Telephone Encounter (Signed)
nmotify pt his insur is no longer covdring twice per day for this, does he think he can gedt by on one per day? If so rx. If not, let him know may need to switch to different agent, have dr scott see his response

## 2016-10-03 NOTE — Telephone Encounter (Signed)
Spoke with patient and informed him per Dr.Steve Luking- His insurance has denied Pantoprazole for twice a day but they will cover it for once a day. Patient stated that once a day will be find. Sending in prescription for once a day.

## 2016-10-03 NOTE — Telephone Encounter (Signed)
Left message return call 10/03/16 

## 2016-10-14 ENCOUNTER — Ambulatory Visit: Payer: BLUE CROSS/BLUE SHIELD | Admitting: Gastroenterology

## 2016-10-28 ENCOUNTER — Other Ambulatory Visit: Payer: Self-pay | Admitting: *Deleted

## 2016-10-28 ENCOUNTER — Telehealth: Payer: Self-pay | Admitting: Family Medicine

## 2016-10-28 ENCOUNTER — Telehealth: Payer: Self-pay | Admitting: Orthopaedic Surgery

## 2016-10-28 MED ORDER — ALLOPURINOL 300 MG PO TABS: 300.0000 mg | ORAL_TABLET | Freq: Every day | ORAL | 5 refills | Status: DC

## 2016-10-28 MED ORDER — LOSARTAN POTASSIUM-HCTZ 100-12.5 MG PO TABS: 1.0000 | ORAL_TABLET | Freq: Every day | ORAL | 1 refills | Status: DC

## 2016-10-28 NOTE — Telephone Encounter (Signed)
Received a fax from [facility] asking if we can change patient's valsartan/hctz  to Losartan/hctz. Please advise?

## 2016-10-28 NOTE — Telephone Encounter (Signed)
Request sent in from pt's pharmacy, [facility], requesting refill on Allopurinol 300 mgs.  They are requesting 90 day supply    Sig: Take 1 tablet (300 mg total) by mouth daily.

## 2016-10-28 NOTE — Telephone Encounter (Signed)
Yes 100/12.5 90 one ref

## 2016-10-28 NOTE — Telephone Encounter (Signed)
rx sent to pharm

## 2016-11-21 ENCOUNTER — Ambulatory Visit: Payer: Commercial Managed Care - PPO | Admitting: Gastroenterology

## 2016-11-25 ENCOUNTER — Encounter: Payer: Self-pay | Admitting: Internal Medicine

## 2016-11-25 ENCOUNTER — Ambulatory Visit: Payer: Commercial Managed Care - PPO | Admitting: Internal Medicine

## 2016-11-25 ENCOUNTER — Ambulatory Visit (INDEPENDENT_AMBULATORY_CARE_PROVIDER_SITE_OTHER): Payer: Commercial Managed Care - PPO | Admitting: Internal Medicine

## 2016-11-25 VITALS — BP 134/78 | HR 87 | Temp 97.9°F | Ht 70.0 in | Wt 245.2 lb

## 2016-11-25 DIAGNOSIS — K2100 Gastro-esophageal reflux disease with esophagitis, without bleeding: Secondary | ICD-10-CM

## 2016-11-25 DIAGNOSIS — Z86010 Personal history of colonic polyps: Secondary | ICD-10-CM

## 2016-11-25 DIAGNOSIS — R1310 Dysphagia, unspecified: Secondary | ICD-10-CM

## 2016-11-25 DIAGNOSIS — K5730 Diverticulosis of large intestine without perforation or abscess without bleeding: Secondary | ICD-10-CM

## 2016-11-25 DIAGNOSIS — R1319 Other dysphagia: Secondary | ICD-10-CM

## 2016-11-25 NOTE — Patient Instructions (Signed)
Continue daily Fiber supplement  Continue Protonix 40 mg daily  Office visit in 1 year.    Repeat colonoscopy in 5 years

## 2016-11-25 NOTE — Progress Notes (Signed)
Primary Care Physician:  Mikey Kirschner, MD Primary Gastroenterologist:  Dr. Gala Romney  Pre-Procedure History & Physical: HPI:  Marcus Frazier is a 56 y.o. male here for follow-up of reflux esophagitis with dysphagia;  status post Marcus Frazier dilation empirically recently. Colonoscopy recently demonstrated diverticulosis and colon adenoma. He is due for surveilance pinous colonoscopy in 5 years. Clinically, doing very well now on Protonix 40 mg daily. No dysphagia or GERD. Is having no rectal bleeding or abdominal pain. Takes equate brand fiber supplement daily. He is pleased with his GI progress.  Past Medical History:  Diagnosis Date  . Back pain   . Colon polyps   . Diverticula of colon   . GERD (gastroesophageal reflux disease)   . Herniated disc, cervical   . HTN (hypertension)   . Kidney stones   . Right ankle injury   . Seasonal allergies     Past Surgical History:  Procedure Laterality Date  . CERVICAL FUSION     C4-C6  . COLONOSCOPY  08/20/2004   diverticula  . COLONOSCOPY  05/12/2011   Marcus Frazier- normal rectum, sigmoid diverticulosis, remainder of colonic mucosa appeared normal.  . COLONOSCOPY N/A 08/08/2016   Procedure: COLONOSCOPY;  Surgeon: Daneil Dolin, MD;  [facility];  Service: Endoscopy;  Laterality: N/A;  1030   . ESOPHAGOGASTRODUODENOSCOPY N/A 08/08/2016   Procedure: ESOPHAGOGASTRODUODENOSCOPY (EGD);  Surgeon: Daneil Dolin, MD;  [facility];  Service: Endoscopy;  Laterality: N/A;  . Marcus Frazier DILATION N/A 08/08/2016   Procedure: Marcus Frazier DILATION;  Surgeon: Daneil Dolin, MD;  [facility];  Service: Endoscopy;  Laterality: N/A;    Prior to Admission medications   Medication Sig Start Date End Date Taking? Authorizing Provider  acetaminophen (TYLENOL) 500 MG tablet Take 1,000 mg by mouth every 6 (six) hours as needed for mild pain, moderate pain or fever.   Yes [provider]  allopurinol (ZYLOPRIM) 300 MG tablet Take 1  tablet (300 mg total) by mouth daily. 10/28/16  Yes Sanjuana Kava, MD  clobetasol ointment (TEMOVATE) 2.70 % Apply 1 application topically 3 (three) times daily as needed (skin irritation).   Yes [provider]  colchicine 0.6 MG tablet One by mouth three times a day for five days for gout pain. 09/16/16  Yes Sanjuana Kava, MD  diclofenac sodium (VOLTAREN) 1 % GEL Apply to affected area twice daily as needed for pain. 09/02/16  Yes [provider]  Fiber POWD Take 2 scoop by mouth daily.   Yes [provider]  ibuprofen (ADVIL,MOTRIN) 800 MG tablet Take 1 tablet (800 mg total) by mouth every 8 (eight) hours as needed. 09/16/16  Yes Sanjuana Kava, MD  losartan-hydrochlorothiazide (HYZAAR) 100-12.5 MG tablet Take 1 tablet by mouth daily. 10/28/16  Yes Mikey Kirschner, MD  pantoprazole (PROTONIX) 40 MG tablet Take 1 tablet by daily 10/03/16  Yes Mikey Kirschner, MD    Allergies as of 11/25/2016  . (No Known Allergies)    Family History  Problem Relation Age of Onset  . Colon cancer Neg Hx     Social History   Social History  . Marital status: Married    Spouse name: N/A  . Number of children: N/A  . Years of education: N/A   Occupational History  . Not on file.   Social History Main Topics  . Smoking status: Former Smoker    Packs/day: 1.00    Years: 4.00    Types: Cigarettes  .  Smokeless tobacco: Never Used  . Alcohol use No  . Drug use: No  . Sexual activity: Not on file   Other Topics Concern  . Not on file   Social History Narrative  . No narrative on file    Review of Systems: See HPI, otherwise negative ROS  Physical Exam: BP 134/78   Pulse 87   Temp 97.9 F (36.6 C) (Oral)   Ht 5\' 10"  (1.778 m)   Wt 245 lb 3.2 oz (111.2 kg)   BMI 35.18 kg/m  General:   Alert,  Well-developed, well-nourished, pleasant and cooperative in NAD Mouth:  No deformity or lesions. Neck:  Supple; no masses or thyromegaly. No significant cervical  adenopathy. Lungs:  Clear throughout to auscultation.   No wheezes, crackles, or rhonchi. No acute distress. Heart:  Regular rate and rhythm; no murmurs, clicks, rubs,  or gallops. Abdomen: Non-distended, normal bowel sounds.  Soft and nontender without appreciable mass or hepatosplenomegaly.  Pulses:  Normal pulses noted. Extremities:  Without clubbing or edema.  Impression:  A pleasant 56 year old Marcus Frazier date GERD. Dysphagia symptoms responded nicely to empiric dilation and bolstered acid suppression regimen. We talked about the chronic nature of reflux and given his complicated disease, he needs to be on a PPI daily indefinitely.  History of colonic adenoma and diverticulosis; due for his last colonoscopy in 5 years  The question arose regarding whether or not he would benefit from a screening chest CT given he does have a smoking history.   Recommendations:  Continue daily Fiber supplement  Continue Protonix 40 mg daily  Office visit in 1 year.    Repeat colonoscopy in 5 years  Patient to ask Dr. Wolfgang Phoenix about the utility of a screening chest CT     Notice: This dictation was prepared with Dragon dictation along with smaller phrase technology. Any transcriptional errors that result from this process are unintentional and may not be corrected upon review.

## 2017-04-10 ENCOUNTER — Ambulatory Visit (INDEPENDENT_AMBULATORY_CARE_PROVIDER_SITE_OTHER): Payer: Commercial Managed Care - PPO | Admitting: Family Medicine

## 2017-04-10 ENCOUNTER — Encounter: Payer: Self-pay | Admitting: Family Medicine

## 2017-04-10 VITALS — BP 138/80 | Ht 70.0 in | Wt 250.0 lb

## 2017-04-10 DIAGNOSIS — M79671 Pain in right foot: Secondary | ICD-10-CM | POA: Diagnosis not present

## 2017-04-10 DIAGNOSIS — K2100 Gastro-esophageal reflux disease with esophagitis, without bleeding: Secondary | ICD-10-CM

## 2017-04-10 DIAGNOSIS — I10 Essential (primary) hypertension: Secondary | ICD-10-CM | POA: Diagnosis not present

## 2017-04-10 DIAGNOSIS — G8929 Other chronic pain: Secondary | ICD-10-CM | POA: Diagnosis not present

## 2017-04-10 MED ORDER — PANTOPRAZOLE SODIUM 40 MG PO TBEC: DELAYED_RELEASE_TABLET | ORAL | 0 refills | Status: DC

## 2017-04-10 MED ORDER — LOSARTAN POTASSIUM-HCTZ 100-12.5 MG PO TABS: 1.0000 | ORAL_TABLET | Freq: Every day | ORAL | 1 refills | Status: DC

## 2017-04-10 MED ORDER — IBUPROFEN 800 MG PO TABS: ORAL_TABLET | ORAL | 2 refills | Status: DC

## 2017-04-10 NOTE — Progress Notes (Signed)
   Subjective:    Patient ID: Marcus Frazier, male    DOB: 07/13/1960, 57 y.o.   MRN: [id number] Patient arrives with numerous concerns HPI Patient is here today to follow up on Htn. He takes Losartan 100-12.5 one daily. He eats good and does not exercise as he should.    patient was diagnosed with gout last summer.  Really not sure if he has a.  Uric acid is always been normal.  Has never had classic gout presentation at the first metatarsophalangeal joint.  Patient really not wanting to take allopurinol concerned about long-term side effects.  Takes bp faithful  Blood pressure medicine and blood pressure levels reviewed today with patient. Compliant with blood pressure medicine. States does not miss a dose. No obvious side effects. Blood pressure generally good when checked elsewhere. Watching salt intake.  Reports overall reflux excellent.  Did have dilatation of the esophagus.  Was placed on proton pump inhibitor high dose.  Patient states he really could not tolerate.  Definitely does not want to use it now.  Instead uses it on a as needed basis   Notes chronic right ankle pain.  Had substantial fracture many years ago.  An MRI.  Reviewed today.  Did reveal evidence of degenerative changes Review of Systems No headache, no major weight loss or weight gain, no chest pain no back pain abdominal pain no change in bowel habits complete ROS otherwise negative     Objective:   Physical Exam Alert and oriented, vitals reviewed and stable, NAD ENT-TM's and ext canals WNL bilat via otoscopic exam Soft palate, tonsils and post pharynx WNL via oropharyngeal exam Neck-symmetric, no masses; thyroid nonpalpable and nontender Pulmonary-no tachypnea or accessory muscle use; Clear without wheezes via auscultation Card--no abnrml murmurs, rhythm reg and rate WNL Carotid pulses symmetric, without bruits Feet bilateral pulses good sensation intact some limitation of motion right ankle.         Assessment & Plan:  Impression 1 hypertension good control discussed to maintain same meds her graph   #2 reflux, clinically stable on as needed Protonix.  Will agree with patient's use in this regard  3.  Intermittent foot pain.  Inflammatory nature.  Has degenerative bases at fifth metatarsal and and ankle./Related to old injury prescribed medicine to use as needed only.  At this time will hold off on diagnosis of gout.  Rationale discussed with patient.  Stop allopurinol.  Greater than 50% of this 25 minute face to face visit was spent in counseling and discussion and coordination of care regarding the above diagnosis/diagnosies

## 2017-09-30 ENCOUNTER — Telehealth: Payer: Self-pay | Admitting: Family Medicine

## 2017-09-30 DIAGNOSIS — Z13220 Encounter for screening for lipoid disorders: Secondary | ICD-10-CM

## 2017-09-30 DIAGNOSIS — Z125 Encounter for screening for malignant neoplasm of prostate: Secondary | ICD-10-CM

## 2017-09-30 DIAGNOSIS — I10 Essential (primary) hypertension: Secondary | ICD-10-CM

## 2017-09-30 NOTE — Telephone Encounter (Signed)
Patient has appointment 8/5 for follow up and needing labs done before appt.

## 2017-09-30 NOTE — Telephone Encounter (Signed)
Blood work ordered in Epic. Patient notified. 

## 2017-09-30 NOTE — Telephone Encounter (Signed)
Lip liv m7 psa cbc 

## 2017-10-06 LAB — HEPATIC FUNCTION PANEL
ALK PHOS: 73 IU/L (ref 39–117)
ALT: 28 IU/L (ref 0–44)
AST: 18 IU/L (ref 0–40)
Albumin: 4.3 g/dL (ref 3.5–5.5)
BILIRUBIN TOTAL: 0.9 mg/dL (ref 0.0–1.2)
BILIRUBIN, DIRECT: 0.19 mg/dL (ref 0.00–0.40)
Total Protein: 6.3 g/dL (ref 6.0–8.5)

## 2017-10-06 LAB — BASIC METABOLIC PANEL
BUN / CREAT RATIO: 10 (ref 9–20)
BUN: 11 mg/dL (ref 6–24)
CHLORIDE: 104 mmol/L (ref 96–106)
CO2: 22 mmol/L (ref 20–29)
Calcium: 9.5 mg/dL (ref 8.7–10.2)
Creatinine, Ser: 1.13 mg/dL (ref 0.76–1.27)
GFR calc non Af Amer: 72 mL/min/{1.73_m2} (ref >59)
GFR, EST AFRICAN AMERICAN: 84 mL/min/{1.73_m2} (ref >59)
Glucose: 103 mg/dL — ABNORMAL HIGH (ref 65–99)
Potassium: 4.1 mmol/L (ref 3.5–5.2)
Sodium: 142 mmol/L (ref 134–144)

## 2017-10-06 LAB — CBC WITH DIFFERENTIAL/PLATELET
BASOS: 1 %
Basophils Absolute: 0.1 10*3/uL (ref 0.0–0.2)
EOS (ABSOLUTE): 0.2 10*3/uL (ref 0.0–0.4)
EOS: 3 %
HEMATOCRIT: 47.7 % (ref 37.5–51.0)
Hemoglobin: 15.6 g/dL (ref 13.0–17.7)
IMMATURE GRANS (ABS): 0 10*3/uL (ref 0.0–0.1)
IMMATURE GRANULOCYTES: 0 %
Lymphocytes Absolute: 1.8 10*3/uL (ref 0.7–3.1)
Lymphs: 24 %
MCH: 29.8 pg (ref 26.6–33.0)
MCHC: 32.7 g/dL (ref 31.5–35.7)
MCV: 91 fL (ref 79–97)
MONOS ABS: 0.5 10*3/uL (ref 0.1–0.9)
Monocytes: 6 %
NEUTROS PCT: 66 %
Neutrophils Absolute: 5.1 10*3/uL (ref 1.4–7.0)
Platelets: 278 10*3/uL (ref 150–450)
RBC: 5.24 x10E6/uL (ref 4.14–5.80)
RDW: 15.2 % (ref 12.3–15.4)
WBC: 7.6 10*3/uL (ref 3.4–10.8)

## 2017-10-06 LAB — PSA: Prostate Specific Ag, Serum: 0.5 ng/mL (ref 0.0–4.0)

## 2017-10-06 LAB — LIPID PANEL
CHOL/HDL RATIO: 3.7 ratio (ref 0.0–5.0)
CHOLESTEROL TOTAL: 173 mg/dL (ref 100–199)
HDL: 47 mg/dL (ref >39)
LDL Calculated: 109 mg/dL — ABNORMAL HIGH (ref 0–99)
TRIGLYCERIDES: 83 mg/dL (ref 0–149)
VLDL Cholesterol Cal: 17 mg/dL (ref 5–40)

## 2017-10-08 ENCOUNTER — Encounter: Payer: Commercial Managed Care - PPO | Admitting: Family Medicine

## 2017-10-12 ENCOUNTER — Encounter: Payer: Self-pay | Admitting: Family Medicine

## 2017-10-12 ENCOUNTER — Ambulatory Visit (INDEPENDENT_AMBULATORY_CARE_PROVIDER_SITE_OTHER): Payer: Commercial Managed Care - PPO | Admitting: Family Medicine

## 2017-10-12 VITALS — BP 134/84 | Ht 70.0 in | Wt 253.0 lb

## 2017-10-12 DIAGNOSIS — I10 Essential (primary) hypertension: Secondary | ICD-10-CM | POA: Diagnosis not present

## 2017-10-12 DIAGNOSIS — Z0000 Encounter for general adult medical examination without abnormal findings: Secondary | ICD-10-CM | POA: Diagnosis not present

## 2017-10-12 DIAGNOSIS — K2100 Gastro-esophageal reflux disease with esophagitis, without bleeding: Secondary | ICD-10-CM

## 2017-10-12 DIAGNOSIS — R079 Chest pain, unspecified: Secondary | ICD-10-CM

## 2017-10-12 MED ORDER — PANTOPRAZOLE SODIUM 40 MG PO TBEC: DELAYED_RELEASE_TABLET | ORAL | 3 refills | Status: DC

## 2017-10-12 MED ORDER — LOSARTAN POTASSIUM-HCTZ 100-12.5 MG PO TABS: 1.0000 | ORAL_TABLET | Freq: Every day | ORAL | 1 refills | Status: DC

## 2017-10-12 NOTE — Progress Notes (Signed)
Subjective:    Patient ID: Marcus Frazier, male    DOB: 06/25/1960, 57 y.o.   MRN: [id number]  HPI  The patient comes in today for a wellness visit.    A review of their health history was completed.  A review of medications was also completed.  Any needed refills; On bp meds  Eating habits: Eat too much.  Falls/  MVA accidents in past few months: No  Regular exercise: No  Specialist pt sees on regular basis: No  Preventative health issues were discussed.   Additional concerns: discomfort in chest from time to time.  Results for orders placed or performed in visit on 09/30/17  Lipid panel  Result Value Ref Range   Cholesterol, Total 173 100 - 199 mg/dL   Triglycerides 83 0 - 149 mg/dL   HDL 47 >39 mg/dL   VLDL Cholesterol Cal 17 5 - 40 mg/dL   LDL Calculated 109 (H) 0 - 99 mg/dL   Chol/HDL Ratio 3.7 0.0 - 5.0 ratio  Hepatic function panel  Result Value Ref Range   Total Protein 6.3 6.0 - 8.5 g/dL   Albumin 4.3 3.5 - 5.5 g/dL   Bilirubin Total 0.9 0.0 - 1.2 mg/dL   Bilirubin, Direct 0.19 0.00 - 0.40 mg/dL   Alkaline Phosphatase 73 39 - 117 IU/L   AST 18 0 - 40 IU/L   ALT 28 0 - 44 IU/L  Basic metabolic panel  Result Value Ref Range   Glucose 103 (H) 65 - 99 mg/dL   BUN 11 6 - 24 mg/dL   Creatinine, Ser 1.13 0.76 - 1.27 mg/dL   GFR calc non Af Amer 72 >59 mL/min/1.73   GFR calc Af Amer 84 >59 mL/min/1.73   BUN/Creatinine Ratio 10 9 - 20   Sodium 142 134 - 144 mmol/L   Potassium 4.1 3.5 - 5.2 mmol/L   Chloride 104 96 - 106 mmol/L   CO2 22 20 - 29 mmol/L   Calcium 9.5 8.7 - 10.2 mg/dL  PSA  Result Value Ref Range   Prostate Specific Ag, Serum 0.5 0.0 - 4.0 ng/mL  CBC with Differential/Platelet  Result Value Ref Range   WBC 7.6 3.4 - 10.8 x10E3/uL   RBC 5.24 4.14 - 5.80 x10E6/uL   Hemoglobin 15.6 13.0 - 17.7 g/dL   Hematocrit 47.7 37.5 - 51.0 %   MCV 91 79 - 97 fL   MCH 29.8 26.6 - 33.0 pg   MCHC 32.7 31.5 - 35.7 g/dL   RDW 15.2 12.3 - 15.4 %   Platelets 278 150 - 450 x10E3/uL   Neutrophils 66 Not Estab. %   Lymphs 24 Not Estab. %   Monocytes 6 Not Estab. %   Eos 3 Not Estab. %   Basos 1 Not Estab. %   Neutrophils Absolute 5.1 1.4 - 7.0 x10E3/uL   Lymphocytes Absolute 1.8 0.7 - 3.1 x10E3/uL   Monocytes Absolute 0.5 0.1 - 0.9 x10E3/uL   EOS (ABSOLUTE) 0.2 0.0 - 0.4 x10E3/uL   Basophils Absolute 0.1 0.0 - 0.2 x10E3/uL   Immature Granulocytes 0 Not Estab. %   Immature Grans (Abs) 0.0 0.0 - 0.1 x10E3/uL   Blood pressure medicine and blood pressure levels reviewed today with patient. Compliant with blood pressure medicine. States does not miss a dose. No obvious side effects. Blood pressure generally good when checked elsewhere. Watching salt intake.  Pt cutting med down to one half tab cause he works out in the heat  Does  not ck b p some  Left ant chest discomfort, not severe, just a bit to let him know it is there. Developed over the last month. 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  HENT: Negative for congestion and rhinorrhea.   Eyes: Negative for discharge.  Respiratory: Negative for cough and wheezing.   Cardiovascular: Negative for chest pain.  Gastrointestinal: Negative for abdominal pain, blood in stool and vomiting.  Genitourinary: Negative for difficulty urinating and frequency.  Musculoskeletal: Negative for neck pain.  Skin: Negative for rash.  Allergic/Immunologic: Negative for environmental allergies and food allergies.  Neurological: Negative for weakness and headaches.  Psychiatric/Behavioral: Negative for agitation.  All other systems reviewed and are negative.        Objective:   Physical Exam  Constitutional: He appears well-developed and well-nourished.  HENT:  Head: Normocephalic and atraumatic.  Right Ear: External ear normal.  Left Ear: External ear normal.  Nose: Nose normal.  Mouth/Throat: Oropharynx is clear and moist.  Eyes: Right eye exhibits no discharge. Left eye exhibits no discharge. No scleral icterus.  Neck: Normal range of motion. Neck supple. No thyromegaly present.  Cardiovascular: Normal rate, regular rhythm and normal heart sounds.  No murmur heard. Pulmonary/Chest: Effort normal and breath sounds normal. No respiratory distress. He has no wheezes.  Abdominal: Soft. Bowel sounds are normal. He exhibits no distension and no mass. There is no tenderness.  Genitourinary: Prostate normal and penis normal.  Musculoskeletal: Normal range of motion. He exhibits no edema.  Lymphadenopathy:    He has no cervical adenopathy.  Neurological: He is alert. He exhibits normal muscle tone. Coordination normal.  Skin: Skin is warm and dry. No erythema.  Psychiatric: He has a normal mood and affect. His behavior is normal. Judgment normal.  Vitals reviewed.    EKG normal sinus rhythm.  No significant ST-T     Assessment & Plan:  #1 wellness exam.  Screening blood work reviewed.  PSA normal and prostate exam normal.  Colonoscopy 2018, next due to 2023.  Patient thinks he had a tetanus shot within the last  #2 Htn blood pressure good on repeat maintain same meds recheck in 6 months  #3/8/chest discomfort multiple risk factors.  Patient had negative cardiac work-up 20 years ago try to advise patient cardiology referral for pain.  Likely not cardiac testing  4.  Reflux has recurred.  History of esophageal dilatation.  Therefore patient advised to get back on Protonix  long-term

## 2017-10-12 NOTE — Patient Instructions (Signed)
Results for orders placed or performed in visit on 09/30/17  Lipid panel  Result Value Ref Range   Cholesterol, Total 173 100 - 199 mg/dL   Triglycerides 83 0 - 149 mg/dL   HDL 47 >39 mg/dL   VLDL Cholesterol Cal 17 5 - 40 mg/dL   LDL Calculated 109 (H) 0 - 99 mg/dL   Chol/HDL Ratio 3.7 0.0 - 5.0 ratio  Hepatic function panel  Result Value Ref Range   Total Protein 6.3 6.0 - 8.5 g/dL   Albumin 4.3 3.5 - 5.5 g/dL   Bilirubin Total 0.9 0.0 - 1.2 mg/dL   Bilirubin, Direct 0.19 0.00 - 0.40 mg/dL   Alkaline Phosphatase 73 39 - 117 IU/L   AST 18 0 - 40 IU/L   ALT 28 0 - 44 IU/L  Basic metabolic panel  Result Value Ref Range   Glucose 103 (H) 65 - 99 mg/dL   BUN 11 6 - 24 mg/dL   Creatinine, Ser 1.13 0.76 - 1.27 mg/dL   GFR calc non Af Amer 72 >59 mL/min/1.73   GFR calc Af Amer 84 >59 mL/min/1.73   BUN/Creatinine Ratio 10 9 - 20   Sodium 142 134 - 144 mmol/L   Potassium 4.1 3.5 - 5.2 mmol/L   Chloride 104 96 - 106 mmol/L   CO2 22 20 - 29 mmol/L   Calcium 9.5 8.7 - 10.2 mg/dL  PSA  Result Value Ref Range   Prostate Specific Ag, Serum 0.5 0.0 - 4.0 ng/mL  CBC with Differential/Platelet  Result Value Ref Range   WBC 7.6 3.4 - 10.8 x10E3/uL   RBC 5.24 4.14 - 5.80 x10E6/uL   Hemoglobin 15.6 13.0 - 17.7 g/dL   Hematocrit 47.7 37.5 - 51.0 %   MCV 91 79 - 97 fL   MCH 29.8 26.6 - 33.0 pg   MCHC 32.7 31.5 - 35.7 g/dL   RDW 15.2 12.3 - 15.4 %   Platelets 278 150 - 450 x10E3/uL   Neutrophils 66 Not Estab. %   Lymphs 24 Not Estab. %   Monocytes 6 Not Estab. %   Eos 3 Not Estab. %   Basos 1 Not Estab. %   Neutrophils Absolute 5.1 1.4 - 7.0 x10E3/uL   Lymphocytes Absolute 1.8 0.7 - 3.1 x10E3/uL   Monocytes Absolute 0.5 0.1 - 0.9 x10E3/uL   EOS (ABSOLUTE) 0.2 0.0 - 0.4 x10E3/uL   Basophils Absolute 0.1 0.0 - 0.2 x10E3/uL   Immature Granulocytes 0 Not Estab. %   Immature Grans (Abs) 0.0 0.0 - 0.1 x10E3/uL

## 2017-10-13 ENCOUNTER — Encounter: Payer: Self-pay | Admitting: Family Medicine

## 2017-10-14 ENCOUNTER — Telehealth: Payer: Self-pay

## 2017-10-14 NOTE — Telephone Encounter (Signed)
good

## 2017-10-14 NOTE — Telephone Encounter (Signed)
I followed up on the Cardiology referral as you requested and the Cardiology had tried calling him to schedule. I called the pt and spoke with him regarding this and gave him the number to Cardiology asked that he please call [phone] to schedule with them ASAP. He states he will call them today.

## 2017-10-19 ENCOUNTER — Encounter: Payer: Self-pay | Admitting: Internal Medicine

## 2017-10-20 ENCOUNTER — Encounter: Payer: Self-pay | Admitting: Family Medicine

## 2017-11-16 ENCOUNTER — Telehealth: Payer: Self-pay | Admitting: Cardiovascular Disease

## 2017-11-16 ENCOUNTER — Ambulatory Visit (INDEPENDENT_AMBULATORY_CARE_PROVIDER_SITE_OTHER): Payer: Commercial Managed Care - PPO | Admitting: Cardiovascular Disease

## 2017-11-16 ENCOUNTER — Encounter: Payer: Self-pay | Admitting: *Deleted

## 2017-11-16 ENCOUNTER — Encounter: Payer: Self-pay | Admitting: Cardiovascular Disease

## 2017-11-16 VITALS — BP 152/80 | HR 88 | Ht 69.0 in | Wt 253.0 lb

## 2017-11-16 DIAGNOSIS — I10 Essential (primary) hypertension: Secondary | ICD-10-CM

## 2017-11-16 DIAGNOSIS — K219 Gastro-esophageal reflux disease without esophagitis: Secondary | ICD-10-CM | POA: Diagnosis not present

## 2017-11-16 DIAGNOSIS — R079 Chest pain, unspecified: Secondary | ICD-10-CM

## 2017-11-16 NOTE — Progress Notes (Signed)
CARDIOLOGY CONSULT NOTE  Patient ID: Marcus Frazier MRN: [id number] DOB/AGE: 1960/08/28 57 y.o.  Admit date: (Not on file) Primary Physician: Mikey Kirschner, MD Referring Physician: Mikey Kirschner, MD  Reason for Consultation: Chest pain  HPI: Marcus Frazier is a 57 y.o. male who is being seen today for the evaluation of chest pain at the request of Luking, Grace Bushy, MD.   Past medical history includes hypertension and GERD.  Chest x-ray on 09/06/2016 was unremarkable.  I personally reviewed the ECG performed on 10/20/2017 which demonstrated normal sinus rhythm with no ischemic ST segment or T wave abnormalities, nor any arrhythmias.  CBC and basic metabolic panel were unremarkable on 10/05/2017.  LDL 109, total cholesterol 173, triglycerides 83, HDL 47.  He had been experiencing episodic precordial discomfort.  It could occur at both rest and with exertion.  There is no associated shortness of breath, lightheadedness, dizziness, palpitations, or syncope.  He had also been off of Protonix and this was restarted about a month ago.  Since that time he has not had any recurrence of chest discomfort.  He said his blood pressure is routinely normal.  He denies dysphagia for solids and liquids.  He denies leg swelling, orthopnea, and paroxysmal nocturnal dyspnea.  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   No Known Allergies  Current Outpatient Medications  Medication Sig Dispense Refill  . acetaminophen (TYLENOL) 500 MG tablet Take 1,000 mg by mouth every 6 (six) hours as needed for mild pain, moderate pain or fever.    . clobetasol ointment (TEMOVATE) 2.42 % Apply 1 application topically 3 (three) times daily as needed (skin irritation).    . Fiber POWD Take 2 scoop by mouth daily.    Marland Kitchen ibuprofen (ADVIL,MOTRIN) 800 MG tablet One po TID 36 tablet 2  . losartan-hydrochlorothiazide (HYZAAR) 100-12.5 MG tablet Take 1 tablet by mouth daily. 90 tablet 1  .  pantoprazole (PROTONIX) 40 MG tablet Take 1 tablet by daily 90 tablet 3   No current facility-administered medications for this visit.     Past Medical History:  Diagnosis Date  . Back pain   . Colon polyps   . Diverticula of colon   . GERD (gastroesophageal reflux disease)   . Herniated disc, cervical   . HTN (hypertension)   . Kidney stones   . Right ankle injury   . Seasonal allergies     Past Surgical History:  Procedure Laterality Date  . CERVICAL FUSION     C4-C6  . COLONOSCOPY  08/20/2004   diverticula  . COLONOSCOPY  05/12/2011   Rourk- normal rectum, sigmoid diverticulosis, remainder of colonic mucosa appeared normal.  . COLONOSCOPY N/A 08/08/2016   Procedure: COLONOSCOPY;  Surgeon: Daneil Dolin, MD;  Location: [facility];  Service: Endoscopy;  Laterality: N/A;  1030   . ESOPHAGOGASTRODUODENOSCOPY N/A 08/08/2016   Procedure: ESOPHAGOGASTRODUODENOSCOPY (EGD);  Surgeon: Daneil Dolin, MD;  Location: [facility];  Service: Endoscopy;  Laterality: N/A;  . Venia Minks DILATION N/A 08/08/2016   Procedure: Venia Minks DILATION;  Surgeon: Daneil Dolin, MD;  Location: [facility];  Service: Endoscopy;  Laterality: N/A;    Social History   Socioeconomic History  . Marital status: Married    Spouse name: Not on file  . Number of children: Not on file  . Years of education: Not on file  . Highest education level: Not on file  Occupational History  .  Not on file  Social Needs  . Financial resource strain: Not on file  . Food insecurity:    Worry: Not on file    Inability: Not on file  . Transportation needs:    Medical: Not on file    Non-medical: Not on file  Tobacco Use  . Smoking status: Former Smoker    Packs/day: 1.00    Years: 4.00    Pack years: 4.00    Types: Cigarettes  . Smokeless tobacco: Never Used  Substance and Sexual Activity  . Alcohol use: No  . Drug use: No  . Sexual activity: Not on file  Lifestyle  . Physical activity:    Days per week:  Not on file    Minutes per session: Not on file  . Stress: Not on file  Relationships  . Social connections:    Talks on phone: Not on file    Gets together: Not on file    Attends religious service: Not on file    Active member of club or organization: Not on file    Attends meetings of clubs or organizations: Not on file    Relationship status: Not on file  . Intimate partner violence:    Fear of current or ex partner: Not on file    Emotionally abused: Not on file    Physically abused: Not on file    Forced sexual activity: Not on file  Other Topics Concern  . Not on file  Social History Narrative  . Not on file     No family history of premature CAD in 1st degree relatives.  Current Meds  Medication Sig  . acetaminophen (TYLENOL) 500 MG tablet Take 1,000 mg by mouth every 6 (six) hours as needed for mild pain, moderate pain or fever.  . clobetasol ointment (TEMOVATE) 9.51 % Apply 1 application topically 3 (three) times daily as needed (skin irritation).  . Fiber POWD Take 2 scoop by mouth daily.  Marland Kitchen ibuprofen (ADVIL,MOTRIN) 800 MG tablet One po TID  . losartan-hydrochlorothiazide (HYZAAR) 100-12.5 MG tablet Take 1 tablet by mouth daily.  . pantoprazole (PROTONIX) 40 MG tablet Take 1 tablet by daily      Review of systems complete and found to be negative unless listed above in HPI    Physical exam Blood pressure (!) 152/80, pulse 88, height 5\' 9"  (1.753 m), weight 253 lb (114.8 kg), SpO2 97 %. General: NAD Neck: No JVD, no thyromegaly or thyroid nodule.  Lungs: Clear to auscultation bilaterally with normal respiratory effort. CV: Nondisplaced PMI. Regular rate and rhythm, normal S1/S2, no S3/S4, no murmur.  No peripheral edema.  No carotid bruit.    Abdomen: Soft, nontender, no distention.  Skin: Intact without lesions or rashes.  Neurologic: Alert and oriented x 3.  Psych: Normal affect. Extremities: No clubbing or cyanosis.  HEENT: Normal.   ECG: Most recent  ECG reviewed.   Labs: Lab Results  Component Value Date/Time   K 4.1 10/05/2017 08:49 AM   BUN 11 10/05/2017 08:49 AM   CREATININE 1.13 10/05/2017 08:49 AM   CREATININE 1.20 08/12/2016 10:12 AM   ALT 28 10/05/2017 08:49 AM   HGB 15.6 10/05/2017 08:49 AM     Lipids: Lab Results  Component Value Date/Time   LDLCALC 109 (H) 10/05/2017 08:49 AM   CHOL 173 10/05/2017 08:49 AM   TRIG 83 10/05/2017 08:49 AM   HDL 47 10/05/2017 08:49 AM        ASSESSMENT AND PLAN:  1.  Chest pain: Symptoms appear somewhat atypical for ischemic heart disease.  He has had symptom resolution since the reinstitution of Protonix.  Cardiac risk factors include hypertension.  I will obtain an exercise tolerance test.  2.  Hypertension: Blood pressure is elevated.  Currently on losartan-hydrochlorothiazide 100-12.5 mg daily.  3.  GERD: Symptoms are stable on Protonix.  No changes.   Disposition: Follow up 3 months    Signed: Kate Sable, M.D., F.A.C.C.  11/16/2017, 1:31 PM

## 2017-11-16 NOTE — Patient Instructions (Signed)
Medication Instructions:  Continue all current medications.  Labwork: none  Testing/Procedures:  Your physician has requested that you have an exercise tolerance test. For further information please visit [email or web address]. Please also follow instruction sheet, as given.  Office will contact with results via phone or letter.    Follow-Up: 3 months   Any Other Special Instructions Will Be Listed Below (If Applicable).  If you need a refill on your cardiac medications before your next appointment, please call your pharmacy.

## 2017-11-16 NOTE — Addendum Note (Signed)
Addended by: Laurine Blazer on: 11/16/2017 01:50 PM   Modules accepted: Orders

## 2017-11-16 NOTE — Telephone Encounter (Signed)
°  Precert needed for: GXT - chest pain   Location: [facility]    Date: Sept 13, 2019  Arrive at 10:00

## 2017-11-20 ENCOUNTER — Ambulatory Visit (HOSPITAL_COMMUNITY)
Admission: RE | Admit: 2017-11-20 | Discharge: 2017-11-20 | Disposition: A | Discharge status: Home / Self Care | Payer: Commercial Managed Care - PPO | Source: Ambulatory Visit | Attending: Cardiovascular Disease | Admitting: Cardiovascular Disease

## 2017-11-20 DIAGNOSIS — R079 Chest pain, unspecified: Secondary | ICD-10-CM | POA: Diagnosis not present

## 2017-11-20 LAB — EXERCISE TOLERANCE TEST
CHL CUP MPHR: 164 {beats}/min
CSEPEDS: 21 s
Estimated workload: 10.1 METS
Exercise duration (min): 7 min
Peak HR: 157 {beats}/min
Percent HR: 95 %
RPE: 13
Rest HR: 64 {beats}/min

## 2017-12-01 ENCOUNTER — Telehealth: Payer: Self-pay | Admitting: *Deleted

## 2017-12-01 NOTE — Telephone Encounter (Signed)
Patient informed and verbalized understanding of plan. 

## 2017-12-01 NOTE — Telephone Encounter (Signed)
-----   Message from Bernita Raisin, RN sent at 11/23/2017 11:14 AM EDT ----- Regarding: results Marcus Frazier ----- Message ----- From: Herminio Commons, MD Sent: 11/20/2017   3:28 PM EDT To: Bernita Raisin, RN  Normal stress test with low risk for blockages. Blood pressure was higher than expected and was high in office. I would recommend continued BP monitoring as meds may need to be adjusted further.

## 2018-02-08 ENCOUNTER — Telehealth: Payer: Self-pay | Admitting: Family Medicine

## 2018-02-08 ENCOUNTER — Other Ambulatory Visit: Payer: Self-pay

## 2018-02-08 MED ORDER — HYDROCHLOROTHIAZIDE 12.5 MG PO CAPS: 12.5000 mg | ORAL_CAPSULE | Freq: Every day | ORAL | 1 refills | Status: DC

## 2018-02-08 MED ORDER — LOSARTAN POTASSIUM 100 MG PO TABS: 100.0000 mg | ORAL_TABLET | Freq: Every day | ORAL | 1 refills | Status: DC

## 2018-02-08 MED ORDER — LOSARTAN POTASSIUM-HCTZ 100-12.5 MG PO TABS: 1.0000 | ORAL_TABLET | Freq: Every day | ORAL | 1 refills | Status: DC

## 2018-02-08 NOTE — Addendum Note (Signed)
Addended by: Dairl Ponder on: 02/08/2018 05:03 PM   Modules accepted: Orders

## 2018-02-08 NOTE — Telephone Encounter (Signed)
Losartan 100 mg 1 daily HCTZ 12.5 mg 1 daily May have 90 of each with 1 refill Please make changes in epic accordingly

## 2018-02-08 NOTE — Telephone Encounter (Signed)
Prescriptions called in to pharmacy

## 2018-02-08 NOTE — Telephone Encounter (Signed)
Pharmacy requesting Losartan 100 mg -HCTZ 12.5 mgbeing separate scripts due to Lostartan 100mg -HCTZ 12.5mg  unavailable

## 2018-02-23 ENCOUNTER — Ambulatory Visit: Payer: Commercial Managed Care - PPO | Admitting: Cardiovascular Disease

## 2018-04-14 ENCOUNTER — Ambulatory Visit: Payer: Commercial Managed Care - PPO | Admitting: Family Medicine

## 2018-04-21 ENCOUNTER — Ambulatory Visit (INDEPENDENT_AMBULATORY_CARE_PROVIDER_SITE_OTHER): Payer: Commercial Managed Care - PPO | Admitting: Family Medicine

## 2018-04-21 ENCOUNTER — Encounter: Payer: Self-pay | Admitting: Family Medicine

## 2018-04-21 VITALS — BP 142/86 | Ht 69.0 in | Wt 258.0 lb

## 2018-04-21 DIAGNOSIS — I10 Essential (primary) hypertension: Secondary | ICD-10-CM | POA: Diagnosis not present

## 2018-04-21 MED ORDER — HYDROCHLOROTHIAZIDE 25 MG PO TABS: 25.0000 mg | ORAL_TABLET | Freq: Every day | ORAL | 1 refills | Status: DC

## 2018-04-21 MED ORDER — LOSARTAN POTASSIUM 100 MG PO TABS: 100.0000 mg | ORAL_TABLET | Freq: Every day | ORAL | 1 refills | Status: DC

## 2018-04-21 NOTE — Progress Notes (Signed)
   Subjective:    Patient ID: Marcus Frazier, male    DOB: 08-13-1960, 58 y.o.   MRN: [id number]  Hypertension  This is a chronic problem. Treatments tried: losartan 100mg  and hctz 12.5mg   Compliance problems include diet and exercise (takes meds every day, could do better with exercise and diet).    Pt concerned that bp was higher than normal the last couple of times he checked it.   BPs a bit higher than normal   Low numb in the low 90s  Was in the 80s for several yrs, but now rising  Exercising some but not s a much as hoped  Six thirty ish in the morn l  Review of Systems No headache, no major weight loss or weight gain, no chest pain no back pain abdominal pain no change in bowel habits complete ROS otherwise negative     Objective:   Physical Exam   Alert vitals stable, NAD. Blood pressure good on repeat. HEENT normal. Lungs clear. Heart regular rate and rhythm.      Assessment & Plan:  Impression essential hypertension.  Suboptimal control discussed increase hydrochlorothiazide component to 425.  Change losartan to nightly maintain hydrochlorothiazide every morning follow-up in 6 months for wellness plus chronic

## 2018-05-14 ENCOUNTER — Ambulatory Visit (INDEPENDENT_AMBULATORY_CARE_PROVIDER_SITE_OTHER): Payer: Commercial Managed Care - PPO | Admitting: Family Medicine

## 2018-05-14 ENCOUNTER — Other Ambulatory Visit: Payer: Self-pay | Admitting: Family Medicine

## 2018-05-14 ENCOUNTER — Encounter: Payer: Self-pay | Admitting: Family Medicine

## 2018-05-14 VITALS — BP 132/86 | Temp 98.6°F | Ht 69.0 in | Wt 259.8 lb

## 2018-05-14 DIAGNOSIS — J019 Acute sinusitis, unspecified: Secondary | ICD-10-CM

## 2018-05-14 MED ORDER — AMOXICILLIN 500 MG PO CAPS: 500.0000 mg | ORAL_CAPSULE | Freq: Three times a day (TID) | ORAL | 0 refills | Status: AC

## 2018-05-14 NOTE — Progress Notes (Signed)
   Subjective:    Patient ID: Marcus Frazier, male    DOB: May 28, 1960, 58 y.o.   MRN: [id number]  Sinusitis  This is a new problem. Episode onset: 5 days. Associated symptoms include congestion, coughing, headaches and sinus pressure. Pertinent negatives include no chills, ear pain or shortness of breath. Treatments tried: tylenol sinus.   6 days ago started with cough and congestion. Dry cough. No fever. Has had a h/a occasionally to frontal sinus area / behind left eye; no h/a currently. Blowing out gunky discharge from nose. Sinus pressure worse on left side. No fever, no difficulty breathing.  No chills or body aches. No change in appetite or energy levels.    Review of Systems  Constitutional: Negative for activity change, appetite change, chills and fever.  HENT: Positive for congestion and sinus pressure. Negative for ear pain and trouble swallowing.   Eyes: Negative for discharge.  Respiratory: Positive for cough. Negative for shortness of breath and wheezing.   Gastrointestinal: Negative for diarrhea and vomiting.  Musculoskeletal: Negative for myalgias.  Neurological: Positive for headaches.       Objective:   Physical Exam Vitals signs and nursing note reviewed.  Constitutional:      General: He is not in acute distress.    Appearance: Normal appearance. He is not toxic-appearing.  HENT:     Head: Normocephalic and atraumatic.     Ears:     Comments: TM dull bilaterally     Nose: Congestion present.     Right Sinus: No maxillary sinus tenderness or frontal sinus tenderness.     Left Sinus: No maxillary sinus tenderness or frontal sinus tenderness.     Mouth/Throat:     Mouth: Mucous membranes are moist.     Pharynx: Oropharynx is clear.  Eyes:     General:        Right eye: No discharge.        Left eye: No discharge.  Neck:     Musculoskeletal: Neck supple. No neck rigidity.  Cardiovascular:     Rate and Rhythm: Normal rate and regular rhythm.     Heart sounds:  Normal heart sounds.  Pulmonary:     Effort: Pulmonary effort is normal. No respiratory distress.     Breath sounds: Normal breath sounds. No wheezing or rales.  Lymphadenopathy:     Cervical: No cervical adenopathy.  Skin:    General: Skin is warm and dry.  Neurological:     Mental Status: He is alert and oriented to person, place, and time.  Psychiatric:        Behavior: Behavior normal.           Assessment & Plan:  Acute rhinosinusitis  Discussed likely post-viral etiology. Will treat with amoxicillin. Symptomatic care discussed. Warning signs discussed. F/u if symptoms worsen or fail to improve.   Symptoms should gradually resolve over the course of the next several days. If high fevers, progressive illness, difficulty breathing, worsening condition or failure for symptoms to improve over the next several days then the patient is to follow-up.  If any emergent conditions the patient is to follow-up in the emergency department otherwise to follow-up in the office.

## 2018-05-14 NOTE — Patient Instructions (Signed)
Sinusitis, Adult  Sinusitis is inflammation of your sinuses. Sinuses are hollow spaces in the bones around your face. Your sinuses are located:   Around your eyes.   In the middle of your forehead.   Behind your nose.   In your cheekbones.  Mucus normally drains out of your sinuses. When your nasal tissues become inflamed or swollen, mucus can become trapped or blocked. This allows bacteria, viruses, and fungi to grow, which leads to infection. Most infections of the sinuses are caused by a virus.  Sinusitis can develop quickly. It can last for up to 4 weeks (acute) or for more than 12 weeks (chronic). Sinusitis often develops after a cold.  What are the causes?  This condition is caused by anything that creates swelling in the sinuses or stops mucus from draining. This includes:   Allergies.   Asthma.   Infection from bacteria or viruses.   Deformities or blockages in your nose or sinuses.   Abnormal growths in the nose (nasal polyps).   Pollutants, such as chemicals or irritants in the air.   Infection from fungi (rare).  What increases the risk?  You are more likely to develop this condition if you:   Have a weak body defense system (immune system).   Do a lot of swimming or diving.   Overuse nasal sprays.   Smoke.  What are the signs or symptoms?  The main symptoms of this condition are pain and a feeling of pressure around the affected sinuses. Other symptoms include:   Stuffy nose or congestion.   Thick drainage from your nose.   Swelling and warmth over the affected sinuses.   Headache.   Upper toothache.   A cough that may get worse at night.   Extra mucus that collects in the throat or the back of the nose (postnasal drip).   Decreased sense of smell and taste.   Fatigue.   A fever.   Sore throat.   Bad breath.  How is this diagnosed?  This condition is diagnosed based on:   Your symptoms.   Your medical history.   A physical exam.   Tests to find out if your condition is  acute or chronic. This may include:  ? Checking your nose for nasal polyps.  ? Viewing your sinuses using a device that has a light (endoscope).  ? Testing for allergies or bacteria.  ? Imaging tests, such as an MRI or CT scan.  In rare cases, a bone biopsy may be done to rule out more serious types of fungal sinus disease.  How is this treated?  Treatment for sinusitis depends on the cause and whether your condition is chronic or acute.   If caused by a virus, your symptoms should go away on their own within 10 days. You may be given medicines to relieve symptoms. They include:  ? Medicines that shrink swollen nasal passages (topical intranasal decongestants).  ? Medicines that treat allergies (antihistamines).  ? A spray that eases inflammation of the nostrils (topical intranasal corticosteroids).  ? Rinses that help get rid of thick mucus in your nose (nasal saline washes).   If caused by bacteria, your health care provider may recommend waiting to see if your symptoms improve. Most bacterial infections will get better without antibiotic medicine. You may be given antibiotics if you have:  ? A severe infection.  ? A weak immune system.   If caused by narrow nasal passages or nasal polyps, you may need   to have surgery.  Follow these instructions at home:  Medicines   Take, use, or apply over-the-counter and prescription medicines only as told by your health care provider. These may include nasal sprays.   If you were prescribed an antibiotic medicine, take it as told by your health care provider. Do not stop taking the antibiotic even if you start to feel better.  Hydrate and humidify     Drink enough fluid to keep your urine pale yellow. Staying hydrated will help to thin your mucus.   Use a cool mist humidifier to keep the humidity level in your home above 50%.   Inhale steam for 10-15 minutes, 3-4 times a day, or as told by your health care provider. You can do this in the bathroom while a hot shower is  running.   Limit your exposure to cool or dry air.  Rest   Rest as much as possible.   Sleep with your head raised (elevated).   Make sure you get enough sleep each night.  General instructions     Apply a warm, moist washcloth to your face 3-4 times a day or as told by your health care provider. This will help with discomfort.   Wash your hands often with soap and water to reduce your exposure to germs. If soap and water are not available, use hand sanitizer.   Do not smoke. Avoid being around people who are smoking (secondhand smoke).   Keep all follow-up visits as told by your health care provider. This is important.  Contact a health care provider if:   You have a fever.   Your symptoms get worse.   Your symptoms do not improve within 10 days.  Get help right away if:   You have a severe headache.   You have persistent vomiting.   You have severe pain or swelling around your face or eyes.   You have vision problems.   You develop confusion.   Your neck is stiff.   You have trouble breathing.  Summary   Sinusitis is soreness and inflammation of your sinuses. Sinuses are hollow spaces in the bones around your face.   This condition is caused by nasal tissues that become inflamed or swollen. The swelling traps or blocks the flow of mucus. This allows bacteria, viruses, and fungi to grow, which leads to infection.   If you were prescribed an antibiotic medicine, take it as told by your health care provider. Do not stop taking the antibiotic even if you start to feel better.   Keep all follow-up visits as told by your health care provider. This is important.  This information is not intended to replace advice given to you by your health care provider. Make sure you discuss any questions you have with your health care provider.  Document Released: 02/24/2005 Document Revised: 07/27/2017 Document Reviewed: 07/27/2017  Elsevier Interactive Patient Education  2019 [employer]

## 2018-10-14 ENCOUNTER — Encounter: Payer: Commercial Managed Care - PPO | Admitting: Family Medicine

## 2018-10-17 IMAGING — DX DG CHEST 2V
2 series · 2 of 2 positions shown · non-contrast
Comparison: 03/08/2007 chest radiographs.

CLINICAL DATA: 56-year-old male with sudden onset shortness of
breath. Fever. Code sepsis.

EXAM:
CHEST  2 VIEW

[chest pa]
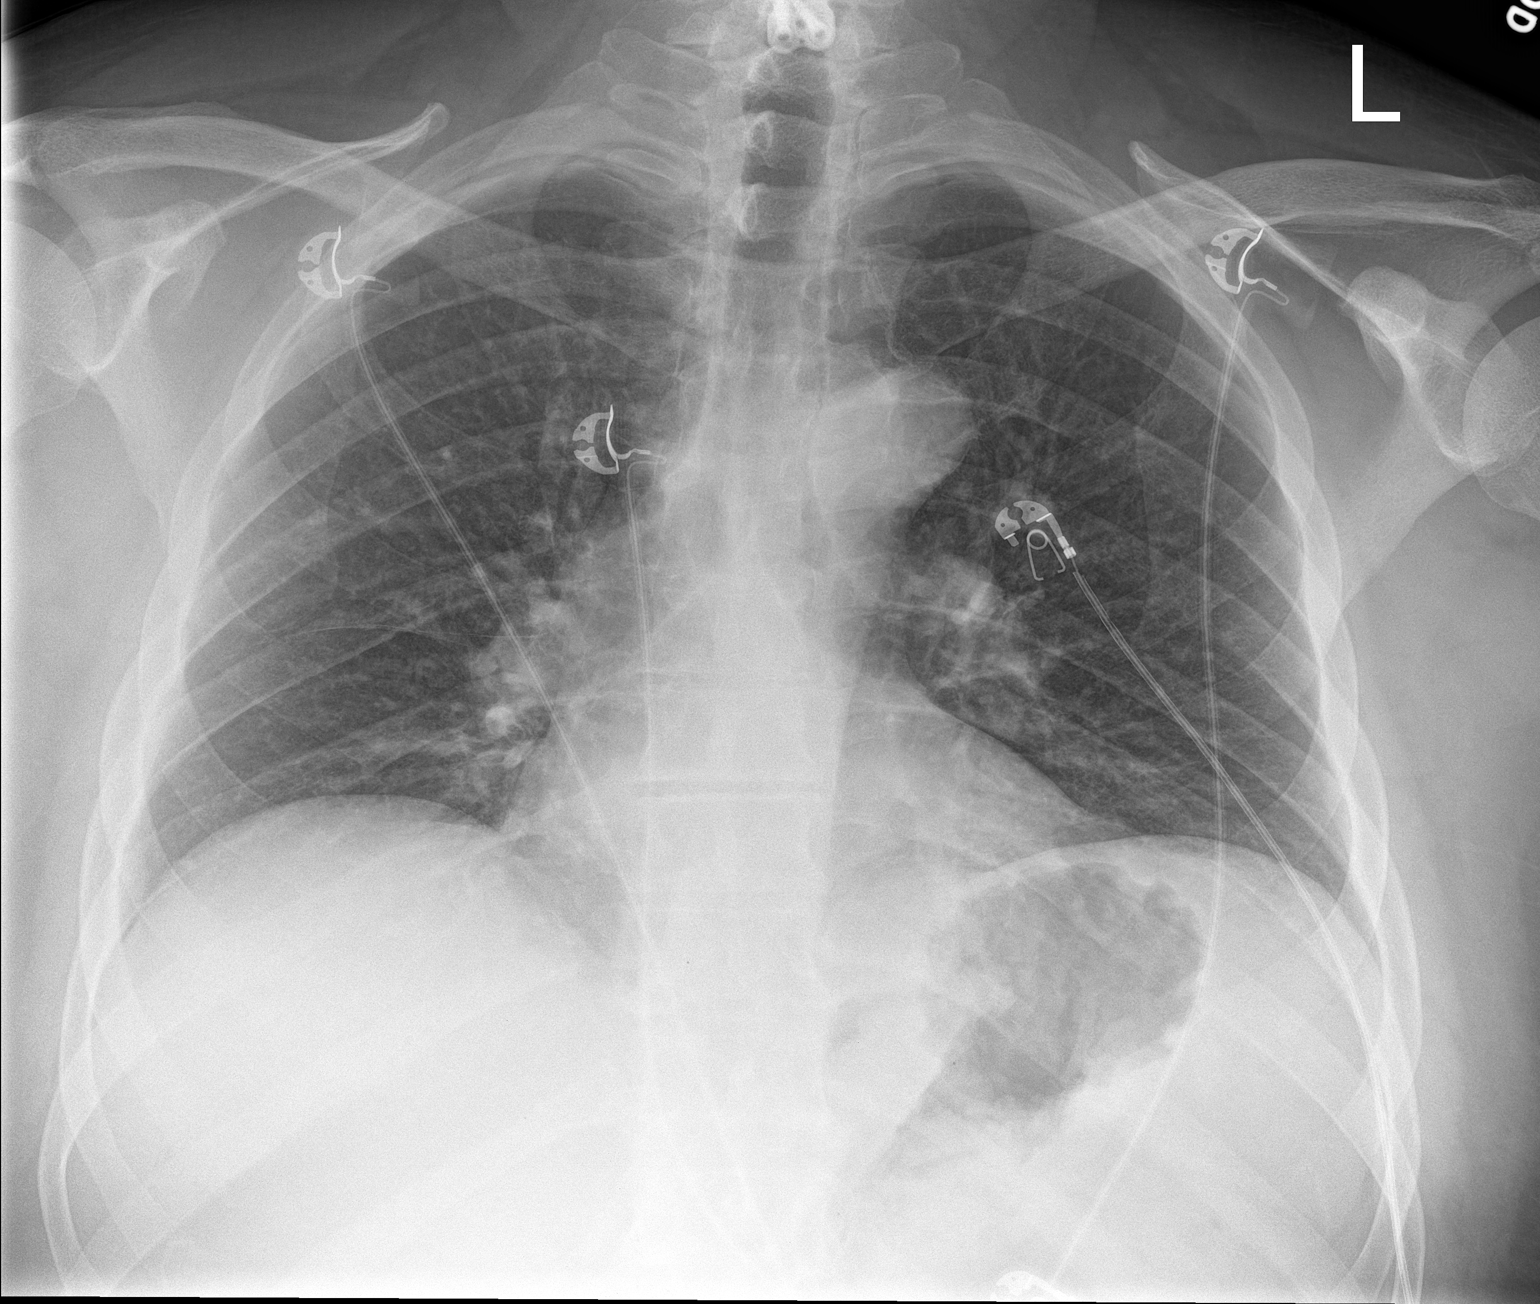

[chest lat]
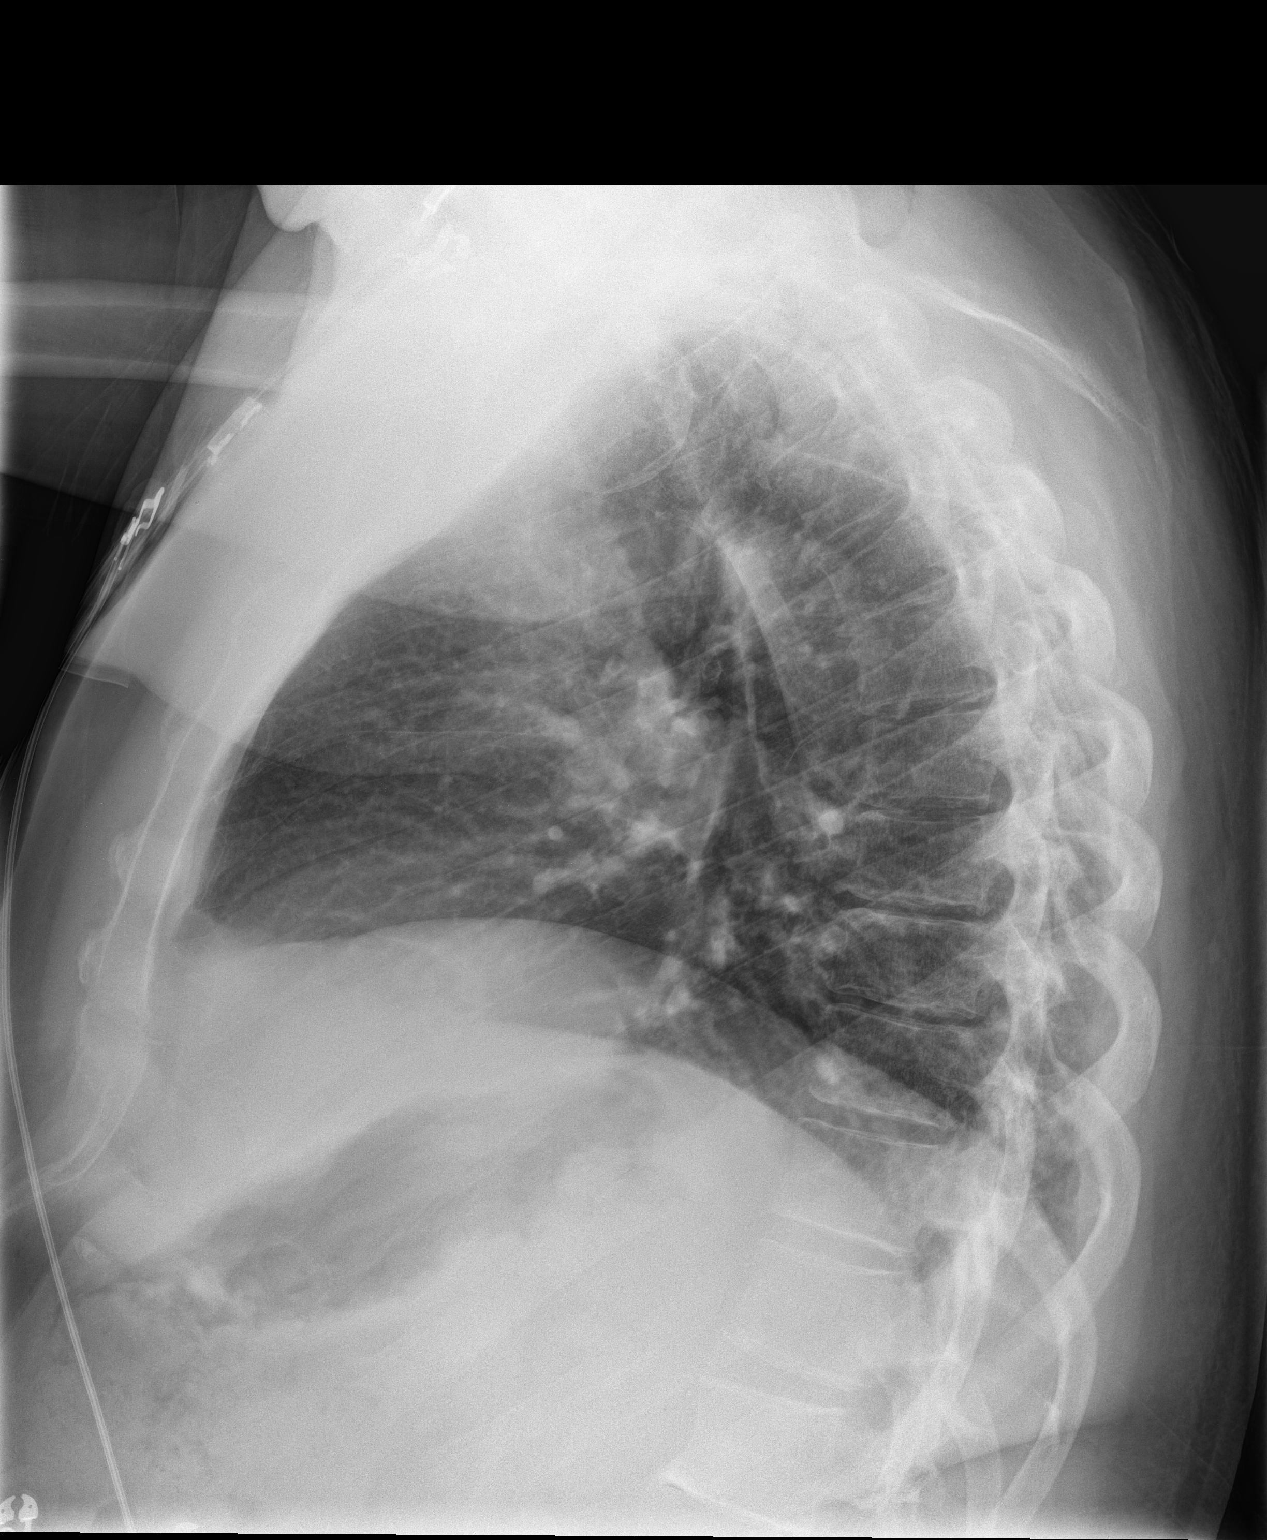

[2 of 2 positions shown; findings below may reference images not displayed]

FINDINGS: Lower lung volumes. Mediastinal contours are stable including mild
tortuosity of the aortic arch. No pneumothorax, pulmonary edema,
pleural effusion or confluent pulmonary opacity. No acute osseous
abnormality identified. Prior cervical ACDF. Negative visible bowel
gas pattern.
IMPRESSION: Low lung volumes, otherwise no acute cardiopulmonary abnormality.

## 2018-11-29 ENCOUNTER — Telehealth: Payer: Self-pay | Admitting: Family Medicine

## 2018-11-29 DIAGNOSIS — Z125 Encounter for screening for malignant neoplasm of prostate: Secondary | ICD-10-CM

## 2018-11-29 DIAGNOSIS — I10 Essential (primary) hypertension: Secondary | ICD-10-CM

## 2018-11-29 DIAGNOSIS — Z13220 Encounter for screening for lipoid disorders: Secondary | ICD-10-CM

## 2018-11-29 DIAGNOSIS — Z79899 Other long term (current) drug therapy: Secondary | ICD-10-CM

## 2018-11-29 NOTE — Telephone Encounter (Signed)
Last labs completed on 10/05/17 CBC, PSA, BMET, LIPID, HEPATIC. Please advise. Thank you

## 2018-11-29 NOTE — Telephone Encounter (Signed)
Patient has physical 9/29 and needing labs done

## 2018-11-29 NOTE — Telephone Encounter (Signed)
Lab orders placed. Left message to return call  

## 2018-11-29 NOTE — Telephone Encounter (Signed)
Rep same 

## 2018-12-01 NOTE — Telephone Encounter (Signed)
Pt contacted and verbalized understanding.  

## 2018-12-03 LAB — CBC WITH DIFFERENTIAL/PLATELET
Basophils Absolute: 0.1 10*3/uL (ref 0.0–0.2)
Basos: 1 %
EOS (ABSOLUTE): 0.2 10*3/uL (ref 0.0–0.4)
Eos: 3 %
Hematocrit: 47.6 % (ref 37.5–51.0)
Hemoglobin: 16.3 g/dL (ref 13.0–17.7)
Immature Grans (Abs): 0 10*3/uL (ref 0.0–0.1)
Immature Granulocytes: 1 %
Lymphocytes Absolute: 2.1 10*3/uL (ref 0.7–3.1)
Lymphs: 24 %
MCH: 29.9 pg (ref 26.6–33.0)
MCHC: 34.2 g/dL (ref 31.5–35.7)
MCV: 87 fL (ref 79–97)
Monocytes Absolute: 0.8 10*3/uL (ref 0.1–0.9)
Monocytes: 9 %
Neutrophils Absolute: 5.5 10*3/uL (ref 1.4–7.0)
Neutrophils: 62 %
Platelets: 274 10*3/uL (ref 150–450)
RBC: 5.45 x10E6/uL (ref 4.14–5.80)
RDW: 14.1 % (ref 11.6–15.4)
WBC: 8.7 10*3/uL (ref 3.4–10.8)

## 2018-12-03 LAB — BASIC METABOLIC PANEL
BUN/Creatinine Ratio: 10 (ref 9–20)
BUN: 13 mg/dL (ref 6–24)
CO2: 25 mmol/L (ref 20–29)
Calcium: 10 mg/dL (ref 8.7–10.2)
Chloride: 100 mmol/L (ref 96–106)
Creatinine, Ser: 1.26 mg/dL (ref 0.76–1.27)
GFR calc Af Amer: 73 mL/min/{1.73_m2} (ref >59)
GFR calc non Af Amer: 63 mL/min/{1.73_m2} (ref >59)
Glucose: 121 mg/dL — ABNORMAL HIGH (ref 65–99)
Potassium: 3.7 mmol/L (ref 3.5–5.2)
Sodium: 139 mmol/L (ref 134–144)

## 2018-12-03 LAB — LIPID PANEL
Chol/HDL Ratio: 4 ratio (ref 0.0–5.0)
Cholesterol, Total: 190 mg/dL (ref 100–199)
HDL: 47 mg/dL (ref >39)
LDL Chol Calc (NIH): 118 mg/dL — ABNORMAL HIGH (ref 0–99)
Triglycerides: 142 mg/dL (ref 0–149)
VLDL Cholesterol Cal: 25 mg/dL (ref 5–40)

## 2018-12-03 LAB — HEPATIC FUNCTION PANEL
ALT: 43 IU/L (ref 0–44)
AST: 32 IU/L (ref 0–40)
Albumin: 4.6 g/dL (ref 3.8–4.9)
Alkaline Phosphatase: 96 IU/L (ref 39–117)
Bilirubin Total: 1.1 mg/dL (ref 0.0–1.2)
Bilirubin, Direct: 0.23 mg/dL (ref 0.00–0.40)
Total Protein: 7.3 g/dL (ref 6.0–8.5)

## 2018-12-03 LAB — PSA: Prostate Specific Ag, Serum: 0.6 ng/mL (ref 0.0–4.0)

## 2018-12-07 ENCOUNTER — Ambulatory Visit (INDEPENDENT_AMBULATORY_CARE_PROVIDER_SITE_OTHER): Payer: Commercial Managed Care - PPO | Admitting: Family Medicine

## 2018-12-07 ENCOUNTER — Other Ambulatory Visit: Payer: Self-pay

## 2018-12-07 VITALS — BP 120/82 | Temp 97.7°F | Ht 69.0 in | Wt 254.0 lb

## 2018-12-07 DIAGNOSIS — I10 Essential (primary) hypertension: Secondary | ICD-10-CM

## 2018-12-07 DIAGNOSIS — M79671 Pain in right foot: Secondary | ICD-10-CM | POA: Diagnosis not present

## 2018-12-07 DIAGNOSIS — M1000 Idiopathic gout, unspecified site: Secondary | ICD-10-CM | POA: Diagnosis not present

## 2018-12-07 DIAGNOSIS — R739 Hyperglycemia, unspecified: Secondary | ICD-10-CM

## 2018-12-07 DIAGNOSIS — Z0000 Encounter for general adult medical examination without abnormal findings: Secondary | ICD-10-CM

## 2018-12-07 DIAGNOSIS — G8929 Other chronic pain: Secondary | ICD-10-CM

## 2018-12-07 MED ORDER — ALLOPURINOL 300 MG PO TABS: 300.0000 mg | ORAL_TABLET | Freq: Every day | ORAL | 5 refills | Status: DC

## 2018-12-07 MED ORDER — PANTOPRAZOLE SODIUM 40 MG PO TBEC: DELAYED_RELEASE_TABLET | ORAL | 1 refills | Status: DC

## 2018-12-07 MED ORDER — HYDROCHLOROTHIAZIDE 25 MG PO TABS: 25.0000 mg | ORAL_TABLET | Freq: Every day | ORAL | 1 refills | Status: DC

## 2018-12-07 MED ORDER — LOSARTAN POTASSIUM 100 MG PO TABS: 100.0000 mg | ORAL_TABLET | Freq: Every day | ORAL | 1 refills | Status: DC

## 2018-12-07 NOTE — Progress Notes (Signed)
Subjective:    Patient ID: Marcus Frazier, male    DOB: 12-Dec-1960, 58 y.o.   MRN: [id number]  HPI  The patient comes in today for a wellness visit.    A review of their health history was completed.  A review of medications was also completed.  Any needed refills; yes  Eating habits: not as good as should  Falls/  MVA accidents in past few months: none  Regular exercise: a little walking  Specialist pt sees on regular basis: none  Preventative health issues were discussed.   Additional concerns: random foot pain from time to time-none today Results for orders placed or performed in visit on 11/29/18  CBC with Differential  Result Value Ref Range   WBC 8.7 3.4 - 10.8 x10E3/uL   RBC 5.45 4.14 - 5.80 x10E6/uL   Hemoglobin 16.3 13.0 - 17.7 g/dL   Hematocrit 47.6 37.5 - 51.0 %   MCV 87 79 - 97 fL   MCH 29.9 26.6 - 33.0 pg   MCHC 34.2 31.5 - 35.7 g/dL   RDW 14.1 11.6 - 15.4 %   Platelets 274 150 - 450 x10E3/uL   Neutrophils 62 Not Estab. %   Lymphs 24 Not Estab. %   Monocytes 9 Not Estab. %   Eos 3 Not Estab. %   Basos 1 Not Estab. %   Neutrophils Absolute 5.5 1.4 - 7.0 x10E3/uL   Lymphocytes Absolute 2.1 0.7 - 3.1 x10E3/uL   Monocytes Absolute 0.8 0.1 - 0.9 x10E3/uL   EOS (ABSOLUTE) 0.2 0.0 - 0.4 x10E3/uL   Basophils Absolute 0.1 0.0 - 0.2 x10E3/uL   Immature Granulocytes 1 Not Estab. %   Immature Grans (Abs) 0.0 0.0 - 0.1 x10E3/uL  PSA  Result Value Ref Range   Prostate Specific Ag, Serum 0.6 0.0 - 4.0 ng/mL  Basic Metabolic Panel (BMET)  Result Value Ref Range   Glucose 121 (H) 65 - 99 mg/dL   BUN 13 6 - 24 mg/dL   Creatinine, Ser 1.26 0.76 - 1.27 mg/dL   GFR calc non Af Amer 63 >59 mL/min/1.73   GFR calc Af Amer 73 >59 mL/min/1.73   BUN/Creatinine Ratio 10 9 - 20   Sodium 139 134 - 144 mmol/L   Potassium 3.7 3.5 - 5.2 mmol/L   Chloride 100 96 - 106 mmol/L   CO2 25 20 - 29 mmol/L   Calcium 10.0 8.7 - 10.2 mg/dL  Lipid Profile  Result Value Ref Range    Cholesterol, Total 190 100 - 199 mg/dL   Triglycerides 142 0 - 149 mg/dL   HDL 47 >39 mg/dL   VLDL Cholesterol Cal 25 5 - 40 mg/dL   LDL Chol Calc (NIH) 118 (H) 0 - 99 mg/dL   Chol/HDL Ratio 4.0 0.0 - 5.0 ratio  Hepatic function panel  Result Value Ref Range   Total Protein 7.3 6.0 - 8.5 g/dL   Albumin 4.6 3.8 - 4.9 g/dL   Bilirubin Total 1.1 0.0 - 1.2 mg/dL   Bilirubin, Direct 0.23 0.00 - 0.40 mg/dL   Alkaline Phosphatase 96 39 - 117 IU/L   AST 32 0 - 40 IU/L   ALT 43 0 - 44 IU/L   Walks and works hard, but nt elev hr  Work outs   No fam hx of fdiabdtes    Blood pressure medicine and blood pressure levels reviewed today with patient. Compliant with blood pressure medicine. States does not miss a dose. No obvious side effects. Blood  pressure generally good when checked elsewhere. Watching salt intake.    Review of Systems  Constitutional: Negative for activity change, appetite change and fever.  HENT: Negative for congestion and rhinorrhea.   Eyes: Negative for discharge.  Respiratory: Negative for cough and wheezing.   Cardiovascular: Negative for chest pain.  Gastrointestinal: Negative for abdominal pain, blood in stool and vomiting.  Genitourinary: Negative for difficulty urinating and frequency.  Musculoskeletal: Negative for neck pain.  Skin: Negative for rash.  Allergic/Immunologic: Negative for environmental allergies and food allergies.  Neurological: Negative for weakness and headaches.  Psychiatric/Behavioral: Negative for agitation.  All other systems reviewed and are negative.      Objective:   Physical Exam Vitals signs reviewed.  Constitutional:      Appearance: He is well-developed.  HENT:     Head: Normocephalic and atraumatic.     Right Ear: External ear normal.     Left Ear: External ear normal.     Nose: Nose normal.  Eyes:     Pupils: Pupils are equal, round, and reactive to light.  Neck:     Musculoskeletal: Normal range of motion and neck  supple.     Thyroid: No thyromegaly.  Cardiovascular:     Rate and Rhythm: Normal rate and regular rhythm.     Heart sounds: Normal heart sounds. No murmur.  Pulmonary:     Effort: Pulmonary effort is normal. No respiratory distress.     Breath sounds: Normal breath sounds. No wheezing.  Abdominal:     General: Bowel sounds are normal. There is no distension.     Palpations: Abdomen is soft. There is no mass.     Tenderness: There is no abdominal tenderness.  Genitourinary:    Penis: Normal.   Musculoskeletal: Normal range of motion.  Lymphadenopathy:     Cervical: No cervical adenopathy.  Skin:    General: Skin is warm and dry.     Findings: No erythema.  Neurological:     Mental Status: He is alert.     Motor: No abnormal muscle tone.  Psychiatric:        Behavior: Behavior normal.        Judgment: Judgment normal.           Assessment & Plan:  Impression 1 wellness exam.  Patient declines vaccines.  Diet discussed.  Exercise discussed.  Up-to-date on colonoscopy.  2.  Hypertension.  Good control discussed maintain same meds  3.  Gout.  Recurrent in nature.  Patient brings in pictures.  Had been treated in the past.  Has stopped therapy.  Now getting several flares per year.  Resume allopurinol 300 mg daily rationale discussed check uric acid in 3 months along with renal function  4.  Prediabetes discussed at length educated at length  Follow-up in 6 months diet exercise discussed weight loss encouraged

## 2018-12-07 NOTE — Patient Instructions (Signed)
Results for orders placed or performed in visit on 11/29/18  CBC with Differential  Result Value Ref Range   WBC 8.7 3.4 - 10.8 x10E3/uL   RBC 5.45 4.14 - 5.80 x10E6/uL   Hemoglobin 16.3 13.0 - 17.7 g/dL   Hematocrit 47.6 37.5 - 51.0 %   MCV 87 79 - 97 fL   MCH 29.9 26.6 - 33.0 pg   MCHC 34.2 31.5 - 35.7 g/dL   RDW 14.1 11.6 - 15.4 %   Platelets 274 150 - 450 x10E3/uL   Neutrophils 62 Not Estab. %   Lymphs 24 Not Estab. %   Monocytes 9 Not Estab. %   Eos 3 Not Estab. %   Basos 1 Not Estab. %   Neutrophils Absolute 5.5 1.4 - 7.0 x10E3/uL   Lymphocytes Absolute 2.1 0.7 - 3.1 x10E3/uL   Monocytes Absolute 0.8 0.1 - 0.9 x10E3/uL   EOS (ABSOLUTE) 0.2 0.0 - 0.4 x10E3/uL   Basophils Absolute 0.1 0.0 - 0.2 x10E3/uL   Immature Granulocytes 1 Not Estab. %   Immature Grans (Abs) 0.0 0.0 - 0.1 x10E3/uL  PSA  Result Value Ref Range   Prostate Specific Ag, Serum 0.6 0.0 - 4.0 ng/mL  Basic Metabolic Panel (BMET)  Result Value Ref Range   Glucose 121 (H) 65 - 99 mg/dL   BUN 13 6 - 24 mg/dL   Creatinine, Ser 1.26 0.76 - 1.27 mg/dL   GFR calc non Af Amer 63 >59 mL/min/1.73   GFR calc Af Amer 73 >59 mL/min/1.73   BUN/Creatinine Ratio 10 9 - 20   Sodium 139 134 - 144 mmol/L   Potassium 3.7 3.5 - 5.2 mmol/L   Chloride 100 96 - 106 mmol/L   CO2 25 20 - 29 mmol/L   Calcium 10.0 8.7 - 10.2 mg/dL  Lipid Profile  Result Value Ref Range   Cholesterol, Total 190 100 - 199 mg/dL   Triglycerides 142 0 - 149 mg/dL   HDL 47 >39 mg/dL   VLDL Cholesterol Cal 25 5 - 40 mg/dL   LDL Chol Calc (NIH) 118 (H) 0 - 99 mg/dL   Chol/HDL Ratio 4.0 0.0 - 5.0 ratio  Hepatic function panel  Result Value Ref Range   Total Protein 7.3 6.0 - 8.5 g/dL   Albumin 4.6 3.8 - 4.9 g/dL   Bilirubin Total 1.1 0.0 - 1.2 mg/dL   Bilirubin, Direct 0.23 0.00 - 0.40 mg/dL   Alkaline Phosphatase 96 39 - 117 IU/L   AST 32 0 - 40 IU/L   ALT 43 0 - 44 IU/L    Preventing Type 2 Diabetes Mellitus Type 2 diabetes (type 2  diabetes mellitus) is a long-term (chronic) disease that affects blood sugar (glucose) levels. Normally, a hormone called insulin allows glucose to enter cells in the body. The cells use glucose for energy. In type 2 diabetes, one or both of these problems may be present:  The body does not make enough insulin.  The body does not respond properly to insulin that it makes (insulin resistance). Insulin resistance or lack of insulin causes excess glucose to build up in the blood instead of going into cells. As a result, high blood glucose (hyperglycemia) develops, which can cause many complications. Being overweight or obese and having an inactive (sedentary) lifestyle can increase your risk for diabetes. Type 2 diabetes can be delayed or prevented by making certain nutrition and lifestyle changes. What nutrition changes can be made?   Eat healthy meals and snacks regularly.  Keep a healthy snack with you for when you get hungry between meals, such as fruit or a handful of nuts.  Eat lean meats and proteins that are low in saturated fats, such as chicken, fish, egg whites, and beans. Avoid processed meats.  Eat plenty of fruits and vegetables and plenty of grains that have not been processed (whole grains). It is recommended that you eat: ? 1?2 cups of fruit every day. ? 2?3 cups of vegetables every day. ? 6?8 oz of whole grains every day, such as oats, whole wheat, bulgur, brown rice, quinoa, and millet.  Eat low-fat dairy products, such as milk, yogurt, and cheese.  Eat foods that contain healthy fats, such as nuts, avocado, olive oil, and canola oil.  Drink water throughout the day. Avoid drinks that contain added sugar, such as soda or sweet tea.  Follow instructions from your health care provider about specific eating or drinking restrictions.  Control how much food you eat at a time (portion size). ? Check food labels to find out the serving sizes of foods. ? Use a kitchen scale to  weigh amounts of foods.  Saute or steam food instead of frying it. Cook with water or broth instead of oils or butter.  Limit your intake of: ? Salt (sodium). Have no more than 1 tsp (2,400 mg) of sodium a day. If you have heart disease or high blood pressure, have less than ? tsp (1,500 mg) of sodium a day. ? Saturated fat. This is fat that is solid at room temperature, such as butter or fat on meat. What lifestyle changes can be made? Activity   Do moderate-intensity physical activity for at least 30 minutes on at least 5 days of the week, or as much as told by your health care provider.  Ask your health care provider what activities are safe for you. A mix of physical activities may be best, such as walking, swimming, cycling, and strength training.  Try to add physical activity into your day. For example: ? Park in spots that are farther away than usual, so that you walk more. For example, park in a far corner of the parking lot when you go to the office or the grocery store. ? Take a walk during your lunch break. ? Use stairs instead of elevators or escalators. Weight Loss  Lose weight as directed. Your health care provider can determine how much weight loss is best for you and can help you lose weight safely.  If you are overweight or obese, you may be instructed to lose at least 5?7 % of your body weight. Alcohol and Tobacco   Limit alcohol intake to no more than 1 drink a day for nonpregnant women and 2 drinks a day for men. One drink equals 12 oz of beer, 5 oz of wine, or 1 oz of hard liquor.  Do not use any tobacco products, such as cigarettes, chewing tobacco, and e-cigarettes. If you need help quitting, ask your health care provider. Work With [facility] Provider  Have your blood glucose tested regularly, as told by your health care provider.  Discuss your risk factors and how you can reduce your risk for diabetes.  Get screening tests as told by your health  care provider. You may have screening tests regularly, especially if you have certain risk factors for type 2 diabetes.  Make an appointment with a diet and nutrition specialist (registered dietitian). A registered dietitian can help you make a healthy  eating plan and can help you understand portion sizes and food labels. Why are these changes important?  It is possible to prevent or delay type 2 diabetes and related health problems by making lifestyle and nutrition changes.  It can be difficult to recognize signs of type 2 diabetes. The best way to avoid possible damage to your body is to take actions to prevent the disease before you develop symptoms. What can happen if changes are not made?  Your blood glucose levels may keep increasing. Having high blood glucose for a long time is dangerous. Too much glucose in your blood can damage your blood vessels, heart, kidneys, nerves, and eyes.  You may develop prediabetes or type 2 diabetes. Type 2 diabetes can lead to many chronic health problems and complications, such as: ? Heart disease. ? Stroke. ? Blindness. ? Kidney disease. ? Depression. ? Poor circulation in the feet and legs, which could lead to surgical removal (amputation) in severe cases. Where to find support  Ask your health care provider to recommend a registered dietitian, diabetes educator, or weight loss program.  Look for local or online weight loss groups.  Join a gym, fitness club, or outdoor activity group, such as a walking club. Where to find more information To learn more about diabetes and diabetes prevention, visit:  American Diabetes Association (ADA): www.diabetes.[employer] of Diabetes and Digestive and Kidney Diseases: FindSpin.nl To learn more about healthy eating, visit:  The U.S. Department of Agriculture Scientist, research (physical sciences)), Choose My Plate: http://wiley-williams.com/  Office of Disease Prevention and Health  Promotion (ODPHP), Dietary Guidelines: SurferLive.at Summary  You can reduce your risk for type 2 diabetes by increasing your physical activity, eating healthy foods, and losing weight as directed.  Talk with your health care provider about your risk for type 2 diabetes. Ask about any blood tests or screening tests that you need to have. This information is not intended to replace advice given to you by your health care provider. Make sure you discuss any questions you have with your health care provider. Document Released: 06/18/2015 Document Revised: 06/18/2018 Document Reviewed: 04/17/2015 Elsevier Patient Education  2020 [place].  Prediabetes Eating Plan Prediabetes is a condition that causes blood sugar (glucose) levels to be higher than normal. This increases the risk for developing diabetes. In order to prevent diabetes from developing, your health care provider may recommend a diet and other lifestyle changes to help you:  Control your blood glucose levels.  Improve your cholesterol levels.  Manage your blood pressure. Your health care provider may recommend working with a diet and nutrition specialist (dietitian) to make a meal plan that is best for you. What are tips for following this plan? Lifestyle  Set weight loss goals with the help of your health care team. It is recommended that most people with prediabetes lose 7% of their current body weight.  Exercise for at least 30 minutes at least 5 days a week.  Attend a support group or seek ongoing support from a mental health counselor.  Take over-the-counter and prescription medicines only as told by your health care provider. Reading food labels  Read food labels to check the amount of fat, salt (sodium), and sugar in prepackaged foods. Avoid foods that have: ? Saturated fats. ? Trans fats. ? Added sugars.  Avoid foods that have more than 300 milligrams (mg) of sodium per serving. Limit your  daily sodium intake to less than 2,300 mg each day. Shopping  Avoid buying pre-made  and processed foods. Cooking  Cook with olive oil. Do not use butter, lard, or ghee.  Bake, broil, grill, or boil foods. Avoid frying. Meal planning   Work with your dietitian to develop an eating plan that is right for you. This may include: ? Tracking how many calories you take in. Use a food diary, notebook, or mobile application to track what you eat at each meal. ? Using the glycemic index (GI) to plan your meals. The index tells you how quickly a food will raise your blood glucose. Choose low-GI foods. These foods take a longer time to raise blood glucose.  Consider following a Mediterranean diet. This diet includes: ? Several servings each day of fresh fruits and vegetables. ? Eating fish at least twice a week. ? Several servings each day of whole grains, beans, nuts, and seeds. ? Using olive oil instead of other fats. ? Moderate alcohol consumption. ? Eating small amounts of red meat and whole-fat dairy.  If you have high blood pressure, you may need to limit your sodium intake or follow a diet such as the DASH eating plan. DASH is an eating plan that aims to lower high blood pressure. What foods are recommended? The items listed below may not be a complete list. Talk with your dietitian about what dietary choices are best for you. Grains Whole grains, such as whole-wheat or whole-grain breads, crackers, cereals, and pasta. Unsweetened oatmeal. Bulgur. Barley. Quinoa. Brown rice. Corn or whole-wheat flour tortillas or taco shells. Vegetables Lettuce. Spinach. Peas. Beets. Cauliflower. Cabbage. Broccoli. Carrots. Tomatoes. Squash. Eggplant. Herbs. Peppers. Onions. Cucumbers. Brussels sprouts. Fruits Berries. Bananas. Apples. Oranges. Grapes. Papaya. Mango. Pomegranate. Kiwi. Grapefruit. Cherries. Meats and other protein foods Seafood. Poultry without skin. Lean cuts of pork and beef. Tofu.  Eggs. Nuts. Beans. Dairy Low-fat or fat-free dairy products, such as yogurt, cottage cheese, and cheese. Beverages Water. Tea. Coffee. Sugar-free or diet soda. Seltzer water. Lowfat or no-fat milk. Milk alternatives, such as soy or almond milk. Fats and oils Olive oil. Canola oil. Sunflower oil. Grapeseed oil. Avocado. Walnuts. Sweets and desserts Sugar-free or low-fat pudding. Sugar-free or low-fat ice cream and other frozen treats. Seasoning and other foods Herbs. Sodium-free spices. Mustard. Relish. Low-fat, low-sugar ketchup. Low-fat, low-sugar barbecue sauce. Low-fat or fat-free mayonnaise. What foods are not recommended? The items listed below may not be a complete list. Talk with your dietitian about what dietary choices are best for you. Grains Refined white flour and flour products, such as bread, pasta, snack foods, and cereals. Vegetables Canned vegetables. Frozen vegetables with butter or cream sauce. Fruits Fruits canned with syrup. Meats and other protein foods Fatty cuts of meat. Poultry with skin. Breaded or fried meat. Processed meats. Dairy Full-fat yogurt, cheese, or milk. Beverages Sweetened drinks, such as sweet iced tea and soda. Fats and oils Butter. Lard. Ghee. Sweets and desserts Baked goods, such as cake, cupcakes, pastries, cookies, and cheesecake. Seasoning and other foods Spice mixes with added salt. Ketchup. Barbecue sauce. Mayonnaise. Summary  To prevent diabetes from developing, you may need to make diet and other lifestyle changes to help control blood sugar, improve cholesterol levels, and manage your blood pressure.  Set weight loss goals with the help of your health care team. It is recommended that most people with prediabetes lose 7 percent of their current body weight.  Consider following a Mediterranean diet that includes plenty of fresh fruits and vegetables, whole grains, beans, nuts, seeds, fish, lean meat, low-fat dairy, and healthy  oils. This information is not intended to replace advice given to you by your health care provider. Make sure you discuss any questions you have with your health care provider. Document Released: 07/11/2014 Document Revised: 06/18/2018 Document Reviewed: 04/30/2016 Elsevier Patient Education  2020 [employer].

## 2019-03-29 ENCOUNTER — Other Ambulatory Visit: Payer: Self-pay

## 2019-03-29 ENCOUNTER — Ambulatory Visit: Payer: Commercial Managed Care - PPO | Attending: Internal Medicine

## 2019-03-29 DIAGNOSIS — Z20822 Contact with and (suspected) exposure to covid-19: Secondary | ICD-10-CM

## 2019-03-30 LAB — NOVEL CORONAVIRUS, NAA: SARS-CoV-2, NAA: DETECTED — AB

## 2019-03-31 ENCOUNTER — Ambulatory Visit (INDEPENDENT_AMBULATORY_CARE_PROVIDER_SITE_OTHER): Payer: Commercial Managed Care - PPO | Admitting: Family Medicine

## 2019-03-31 ENCOUNTER — Telehealth: Payer: Self-pay | Admitting: Internal Medicine

## 2019-03-31 ENCOUNTER — Telehealth: Payer: Self-pay | Admitting: Family Medicine

## 2019-03-31 ENCOUNTER — Other Ambulatory Visit: Payer: Self-pay

## 2019-03-31 DIAGNOSIS — U071 COVID-19: Secondary | ICD-10-CM

## 2019-03-31 MED ORDER — AMOXICILLIN 500 MG PO CAPS: ORAL_CAPSULE | ORAL | 0 refills | Status: DC

## 2019-03-31 NOTE — Telephone Encounter (Signed)
Pt received a mychart message regarding the infusion that is available and he qualifies and would like Dr. Jeannine Kitten option on doing the infusions.

## 2019-03-31 NOTE — Telephone Encounter (Signed)
So at age 59 he does barely qualify for the monoclonal antibody intervention, I really think it is more beneficial for those with higher number of chronic problems or higher age, but theoretically it does somewhat decrease the risk of hospitalization with this virus,  and since it  is currently free under the program, and most tolerate it well, so sure if qualifies can get it and slightly reduce risk of severe illness

## 2019-03-31 NOTE — Telephone Encounter (Signed)
Pt received a message regarding the monoclonial antibody infusion. Please advise. Thank you

## 2019-03-31 NOTE — Progress Notes (Signed)
   Subjective:  Audio  Patient ID: Marcus Frazier, male    DOB: December 10, 1960, 59 y.o.   MRN: [id number]  HPI Pt tested positive for COVID on January 19. Pt is having headache, body aches, congestion and sore throat that started last night. Pt has been taking Tylenol Severe Cold and Flu which helps.   Virtual Visit via Telephone Note  I connected with Marcus Frazier on 03/31/19 at 10:00 AM EST by telephone and verified that I am speaking with the correct person using two identifiers.  Location: Patient: home Provider: office   I discussed the limitations, risks, security and privacy concerns of performing an evaluation and management service by telephone and the availability of in person appointments. I also discussed with the patient that there may be a patient responsible charge related to this service. The patient expressed understanding and agreed to proceed.   History of Present Illness:    Observations/Objective:   Assessment and Plan:   Follow Up Instructions:    I discussed the assessment and treatment plan with the patient. The patient was provided an opportunity to ask questions and all were answered. The patient agreed with the plan and demonstrated an understanding of the instructions.   The patient was advised to call back or seek an in-person evaluation if the symptoms worsen or if the condition fails to improve as anticipated.  I provided 22 minutes of non-face-to-face time during this encounter.   [place] on Monday  Sinus cong   achimness in the body  Got test 19th  positve   Quarantined  Notes some sore throat  Wondered if can take something   Trying to wear         Review of Systems No abdominal pain no no shortness of breath no substantial cough    Objective:   Physical Exam  Virtual      Assessment & Plan:  Impression COVID-19 infection.  Long discussion held.  Quarantine discussed amoxicillin prescribed for sinusitis component.   Warning signs discussed.  Use of pulse oximeter discussed.  Numerous questions answered.

## 2019-03-31 NOTE — Telephone Encounter (Signed)
Patient advised Dr Richardson Landry stated: at age 59 he does barely qualify for the monoclonal antibody intervention, Dr Richardson Landry really thinks it is more beneficial for those with higher number of chronic problems or higher age, but theoretically it does somewhat decrease the risk of hospitalization with this virus,  and since it  is currently free under the program, and most tolerate it well, so sure if qualifies can get it and slightly reduce risk of severe illness. Patient verbalized understanding.

## 2019-03-31 NOTE — Telephone Encounter (Signed)
  I connected by phone with Marcus Frazier on 03/31/2019 at 1:57 PM to discuss the potential use of an new treatment for mild to moderate COVID-19 viral infection in non-hospitalized patients. More information regarding the infusion has been sent to pt via MyChart along with call back number should he wish to proceed with monoclonal antibody infusion. Pt aware of time constraints. Symptom onset 1/18 so would need to schedule infusion on or before 04/06/19. Pt verified understanding.   This patient is a 59 y.o. male that meets the FDA criteria for Emergency Use Authorization of bamlanivimab or casirivimab\imdevimab.  Has a (+) direct SARS-CoV-2 viral test result  Has mild or moderate COVID-19   Is ? 59 years of age and weighs ? 40 kg  Is NOT hospitalized due to COVID-19  Is NOT requiring oxygen therapy or requiring an increase in baseline oxygen flow rate due to COVID-19  Is within 10 days of symptom onset  Has at least one of the high risk factor(s) for progression to severe COVID-19 and/or hospitalization as defined in EUA.  Specific high risk criteria : Hypertension > 55yo

## 2019-04-05 ENCOUNTER — Telehealth: Payer: Self-pay | Admitting: Family Medicine

## 2019-04-05 NOTE — Telephone Encounter (Signed)
Pt had virtual 1/21 he is still currently taking antibiotic but wants to know if there is anything he can take for the sinus congestion.

## 2019-04-05 NOTE — Telephone Encounter (Signed)
May [place] afrin spray for five d max, may take sudafed tho migh bump up bp slightly

## 2019-04-05 NOTE — Telephone Encounter (Signed)
Contacted pt and pt verbalized understanding 

## 2019-04-07 ENCOUNTER — Telehealth: Payer: Self-pay | Admitting: Family Medicine

## 2019-04-07 NOTE — Telephone Encounter (Signed)
Patient had phone visit on 1/21 to discuss positive test but now still has headache that he cant get rid of, not eatting that much ever.He wants to know what he can take for the headache. Please advise

## 2019-04-07 NOTE — Telephone Encounter (Signed)
Please advise. Thank you

## 2019-04-08 ENCOUNTER — Ambulatory Visit (INDEPENDENT_AMBULATORY_CARE_PROVIDER_SITE_OTHER): Payer: Commercial Managed Care - PPO | Admitting: Family Medicine

## 2019-04-08 ENCOUNTER — Telehealth: Payer: Self-pay | Admitting: Family Medicine

## 2019-04-08 DIAGNOSIS — U071 COVID-19: Secondary | ICD-10-CM | POA: Diagnosis not present

## 2019-04-08 DIAGNOSIS — J019 Acute sinusitis, unspecified: Secondary | ICD-10-CM

## 2019-04-08 MED ORDER — HYDROCODONE-ACETAMINOPHEN 5-325 MG PO TABS: 1.0000 | ORAL_TABLET | Freq: Four times a day (QID) | ORAL | 0 refills | Status: DC | PRN

## 2019-04-08 MED ORDER — CEFDINIR 300 MG PO CAPS: 300.0000 mg | ORAL_CAPSULE | Freq: Two times a day (BID) | ORAL | 0 refills | Status: DC

## 2019-04-08 NOTE — Telephone Encounter (Signed)
Phone visit this aft

## 2019-04-08 NOTE — Telephone Encounter (Signed)
Pt contacted and verbalized understanding. Transferred up front to set up appt.

## 2019-04-08 NOTE — Telephone Encounter (Signed)
Wife calling back checking on message from yesterday.  She said the worst thing he is having now is the headache. No SOB but said the headache was so bad that he can't function, doesn't want to eat or move.  She said he is almost finished with the amoxicillin.  She wasn't sure what might help and she said that patient would be willing to do a phone visit if that will help. "Anything to help make his head stop hurting"

## 2019-04-08 NOTE — Progress Notes (Signed)
   Subjective:  Audio  Patient ID: Marcus Frazier, male    DOB: [birth year], 59 y.o.   MRN: [id number]  HPIsinus symptoms started on 1/18. covid test done on the 19th and it was positive. Having  headache, weakness, congestion and fever. Taking amoxicillin and tylenol cold and flu.   Virtual Visit via Telephone Note  I connected with Marcus Frazier on 04/08/19 at  2:00 PM EST by telephone and verified that I am speaking with the correct person using two identifiers.  Location: Patient: home Provider: office   I discussed the limitations, risks, security and privacy concerns of performing an evaluation and management service by telephone and the availability of in person appointments. I also discussed with the patient that there may be a patient responsible charge related to this service. The patient expressed understanding and agreed to proceed.   History of Present Illness:    Observations/Objective:   Assessment and Plan:   Follow Up Instructions:    I discussed the assessment and treatment plan with the patient. The patient was provided an opportunity to ask questions and all were answered. The patient agreed with the plan and demonstrated an understanding of the instructions.   The patient was advised to call back or seek an in-person evaluation if the symptoms worsen or if the condition fails to improve as anticipated.  I provided 20 minutes of non-face-to-face time during this encounter.   Oxygen sat 94 95 range  We are now day 11 of illness.  Still fairly symptomatic.  Severe headaches.  Reminiscent somewhat of sinusitis.  Left-sided anterior frontal.  Also some achiness in joints.  Ongoing low-grade fevers.  Intermittent cough.  Occasional mild shortness of breath with exertion.  Oxygenation maintaining at 94-95 range.  Appetite fair.  Nausea is minimal.  No chest pain.    Review of Systems See above    Objective:   Physical Exam  Virtual      Assessment &  Plan:  Impression COVID-19 infection with potential element of sinusitis.  Oxygenation stable perhaps down here from baseline.  Not worsening.  Will cover with antibiotics for secondary rhinosinusitis.  Also hydrocodone for pain control.  Also warning signs discussed again at length/patient to call us Monday with status

## 2019-04-08 NOTE — Telephone Encounter (Signed)
Seen 03/31/2019 for COVID. Please advise. Thank you

## 2019-04-11 ENCOUNTER — Telehealth: Payer: Self-pay | Admitting: Family Medicine

## 2019-04-11 NOTE — Telephone Encounter (Signed)
FYI- Patient was told to call back to let know how he was doing. He states feeling much better after another round of antibiotics

## 2019-04-11 NOTE — Telephone Encounter (Signed)
Pt states he still has congestion, severe headache is gone, no fever, no sob. Still taking antibiotic. Pt states feels better than he did. I advised him to call back if not continuing to get better or if getting worse. He verbalized understanding.

## 2019-04-11 NOTE — Telephone Encounter (Signed)
good

## 2019-04-13 ENCOUNTER — Encounter: Payer: Self-pay | Admitting: Family Medicine

## 2019-06-06 ENCOUNTER — Ambulatory Visit (INDEPENDENT_AMBULATORY_CARE_PROVIDER_SITE_OTHER): Payer: Commercial Managed Care - PPO | Admitting: Family Medicine

## 2019-06-06 DIAGNOSIS — I10 Essential (primary) hypertension: Secondary | ICD-10-CM | POA: Diagnosis not present

## 2019-06-06 DIAGNOSIS — M1000 Idiopathic gout, unspecified site: Secondary | ICD-10-CM | POA: Diagnosis not present

## 2019-06-06 MED ORDER — LOSARTAN POTASSIUM 100 MG PO TABS: 100.0000 mg | ORAL_TABLET | Freq: Every day | ORAL | 1 refills | Status: DC

## 2019-06-06 MED ORDER — PANTOPRAZOLE SODIUM 40 MG PO TBEC: DELAYED_RELEASE_TABLET | ORAL | 1 refills | Status: DC

## 2019-06-06 MED ORDER — ALLOPURINOL 300 MG PO TABS: 300.0000 mg | ORAL_TABLET | Freq: Every day | ORAL | 1 refills | Status: DC

## 2019-06-06 MED ORDER — HYDROCHLOROTHIAZIDE 25 MG PO TABS: 25.0000 mg | ORAL_TABLET | Freq: Every day | ORAL | 1 refills | Status: DC

## 2019-06-06 NOTE — Progress Notes (Signed)
   Subjective:  Audio only  Patient ID: Marcus Frazier, male    DOB: 01-Feb-1961, 59 y.o.   MRN: [id number]  Hypertension This is a chronic problem. Treatments tried: losartan, hctz. Compliance problems: takes med every day, some walking for exercise, eats as healthy as he can.    Virtual Visit via Telephone Note  I connected with Marcus Frazier on 06/06/19 at  3:00 PM EDT by telephone and verified that I am speaking with the correct person using two identifiers.  Location: Patient: home Provider: office   I discussed the limitations, risks, security and privacy concerns of performing an evaluation and management service by telephone and the availability of in person appointments. I also discussed with the patient that there may be a patient responsible charge related to this service. The patient expressed understanding and agreed to proceed.   History of Present Illness:    Observations/Objective:   Assessment and Plan:   Follow Up Instructions:    I discussed the assessment and treatment plan with the patient. The patient was provided an opportunity to ask questions and all were answered. The patient agreed with the plan and demonstrated an understanding of the instructions.   The patient was advised to call back or seek an in-person evaluation if the symptoms worsen or if the condition fails to improve as anticipated.  I provided 23 minutes of non-face-to-face time during this encounter.  Blood pressure medicine and blood pressure levels reviewed today with patient. Compliant with blood pressure medicine. States does not miss a dose. No obvious side effects. Blood pressure generally good when checked elsewhere. Watching salt intake.    130s to hi 80s          Review of Systems No headache, no major weight loss or weight gain, no chest pain no back pain abdominal pain no change in bowel habits complete ROS otherwise negative     Objective:   Physical  Exam  Virtual      Assessment & Plan:  Impression 1 hypertension.  Currently controlled discussed maintain same meds  2.  Status post COVID-19.  Patient feels he pretty much is recovered from this.  Thankfully.  3.  Gout clinically stable  Medications refilled for 6 months.  Diet exercise discussed.  Follow-up then.  Of note patient did not get additional blood work 6 months ago as recommended.

## 2020-02-01 ENCOUNTER — Encounter: Payer: Self-pay | Admitting: Family Medicine

## 2020-02-01 ENCOUNTER — Ambulatory Visit (INDEPENDENT_AMBULATORY_CARE_PROVIDER_SITE_OTHER): Payer: Commercial Managed Care - PPO | Admitting: Family Medicine

## 2020-02-01 ENCOUNTER — Other Ambulatory Visit: Payer: Self-pay

## 2020-02-01 DIAGNOSIS — R739 Hyperglycemia, unspecified: Secondary | ICD-10-CM | POA: Diagnosis not present

## 2020-02-01 DIAGNOSIS — R0982 Postnasal drip: Secondary | ICD-10-CM | POA: Diagnosis not present

## 2020-02-01 DIAGNOSIS — I10 Essential (primary) hypertension: Secondary | ICD-10-CM | POA: Diagnosis not present

## 2020-02-01 DIAGNOSIS — Z125 Encounter for screening for malignant neoplasm of prostate: Secondary | ICD-10-CM

## 2020-02-01 MED ORDER — HYDROCHLOROTHIAZIDE 25 MG PO TABS
25.0000 mg | ORAL_TABLET | Freq: Every day | ORAL | 1 refills | Status: DC
Start: 2020-02-01 — End: 2020-08-20

## 2020-02-01 MED ORDER — ALLOPURINOL 300 MG PO TABS: 300.0000 mg | ORAL_TABLET | Freq: Every day | ORAL | 1 refills | Status: DC

## 2020-02-01 MED ORDER — PANTOPRAZOLE SODIUM 40 MG PO TBEC
DELAYED_RELEASE_TABLET | ORAL | 1 refills | Status: DC
Start: 2020-02-01 — End: 2020-08-20

## 2020-02-01 MED ORDER — LOSARTAN POTASSIUM 100 MG PO TABS
100.0000 mg | ORAL_TABLET | Freq: Every day | ORAL | 1 refills | Status: DC
Start: 2020-02-01 — End: 2020-08-20

## 2020-02-01 MED ORDER — FLUTICASONE PROPIONATE 50 MCG/ACT NA SUSP: 2.0000 | Freq: Every day | NASAL | 1 refills | Status: AC

## 2020-02-01 NOTE — Progress Notes (Signed)
Patient ID: Marcus Frazier, male    DOB: [birth year], 59 y.o.   MRN: [id number]   Virtual Visit via Telephone Note  I connected with Warnell Forester on 02/04/20 at 10:40 AM EST by telephone and verified that I am speaking with the correct person using two identifiers.  Location: Patient: car Provider: office   I discussed the limitations, risks, security and privacy concerns of performing an evaluation and management service by telephone and the availability of in person appointments. I also discussed with the patient that there may be a patient responsible charge related to this service. The patient expressed understanding and agreed to proceed.  Chief Complaint  Patient presents with  . Sinusitis   Subjective:    HPI  Pt presented to office and failed covid screening.   Turned office visit to phone visit. Pt having some post nasal drip. Started 4 days. Taking otc antihistamine. No fever, coughing, sore throat, ear pain.  No sick contacts.  Does work with public but is masking.  No covid vaccines.  Had covid in 1/21. Lasted about 3 wks but was mild course. Has not had covid testing.   Pt states he has been out of all of his meds for about 10 days.   Pt denies facial pain, green discharge from nose or fever.  Medical History D[place]wi has a past medical history of Back pain, Colon polyps, Diverticula of colon, GERD (gastroesophageal reflux disease), Herniated disc, cervical, HTN (hypertension), Kidney stones, Right ankle injury, and Seasonal allergies.   Outpatient Encounter Medications as of 02/01/2020  Medication Sig  . acetaminophen (TYLENOL) 500 MG tablet Take 1,000 mg by mouth every 6 (six) hours as needed for mild pain, moderate pain or fever.  Marland Kitchen allopurinol (ZYLOPRIM) 300 MG tablet Take 1 tablet (300 mg total) by mouth daily.  . clobetasol ointment (TEMOVATE) 9.38 % Apply 1 application topically 3 (three) times daily as needed (skin irritation).  . Fiber POWD Take 2  scoop by mouth daily.  . hydrochlorothiazide (HYDRODIURIL) 25 MG tablet Take 1 tablet (25 mg total) by mouth daily.  Marland Kitchen losartan (COZAAR) 100 MG tablet Take 1 tablet (100 mg total) by mouth daily.  . pantoprazole (PROTONIX) 40 MG tablet Take 1 tablet by daily  . [DISCONTINUED] allopurinol (ZYLOPRIM) 300 MG tablet Take 1 tablet (300 mg total) by mouth daily.  . [DISCONTINUED] hydrochlorothiazide (HYDRODIURIL) 25 MG tablet Take 1 tablet (25 mg total) by mouth daily.  . [DISCONTINUED] losartan (COZAAR) 100 MG tablet Take 1 tablet (100 mg total) by mouth daily.  . [DISCONTINUED] pantoprazole (PROTONIX) 40 MG tablet Take 1 tablet by daily  . fluticasone (FLONASE) 50 MCG/ACT nasal spray Place 2 sprays into both nostrils daily.   No facility-administered encounter medications on file as of 02/01/2020.     Review of Systems  Constitutional: Negative for chills and fever.  HENT: Positive for postnasal drip. Negative for congestion, rhinorrhea, sinus pressure, sinus pain and sore throat.   Respiratory: Negative for cough, shortness of breath and wheezing.   Cardiovascular: Negative for chest pain and leg swelling.  Gastrointestinal: Negative for abdominal pain, diarrhea, nausea and vomiting.  Genitourinary: Negative for dysuria and frequency.  Skin: Negative for rash.  Neurological: Negative for dizziness, weakness and headaches.     Vitals There were no vitals taken for this visit.  Objective:   Physical Exam  No PE due to phone visit.  Assessment and Plan   1. Hyperglycemia - CMP14+EGFR  2. Primary hypertension -  CBC - CMP14+EGFR - Lipid panel  3. Screening PSA (prostate specific antigen) - PSA; Future  4. Post-nasal drip - fluticasone (FLONASE) 50 MCG/ACT nasal spray; Place 2 sprays into both nostrils daily.  Dispense: 16 g; Refill: 1    Reviewed with pt that pt needing to get drive through testing for covid to make sure not having covid.  But in meantime gave advise to  use flonase and otc allergy medications.  If having pain in face, fever, worsening coughing or green discharge to call back and may need abx at that time.  htn- pt is out of medications. Will give refill, however,  have pt get labs and return for office visit. Cont with hctz, losartan.  Pt out of protonix and Allopurinol. Refills given.  F/u in 30 day for recheck bp and review labs.    Follow Up Instructions:    I discussed the assessment and treatment plan with the patient. The patient was provided an opportunity to ask questions and all were answered. The patient agreed with the plan and demonstrated an understanding of the instructions.   The patient was advised to call back or seek an in-person evaluation if the symptoms worsen or if the condition fails to improve as anticipated.  I provided 12 minutes of non-face-to-face time during this encounter.   [place], DO

## 2020-02-16 ENCOUNTER — Encounter: Payer: Self-pay | Admitting: *Deleted

## 2020-02-16 ENCOUNTER — Telehealth: Payer: Self-pay | Admitting: *Deleted

## 2020-02-16 NOTE — Telephone Encounter (Signed)
Marcus Frazier, Malena M, DO  [facility] call pt to see if had labs, needing fasting labs before next visit and for more refills.

## 2020-02-16 NOTE — Telephone Encounter (Signed)
My Chart message sent to remind patient to have labs done

## 2020-02-27 ENCOUNTER — Other Ambulatory Visit: Payer: Self-pay | Admitting: *Deleted

## 2020-02-27 DIAGNOSIS — Z79899 Other long term (current) drug therapy: Secondary | ICD-10-CM

## 2020-02-27 DIAGNOSIS — Z13220 Encounter for screening for lipoid disorders: Secondary | ICD-10-CM

## 2020-02-27 DIAGNOSIS — R739 Hyperglycemia, unspecified: Secondary | ICD-10-CM

## 2020-02-27 DIAGNOSIS — I10 Essential (primary) hypertension: Secondary | ICD-10-CM

## 2020-02-27 DIAGNOSIS — Z125 Encounter for screening for malignant neoplasm of prostate: Secondary | ICD-10-CM

## 2020-03-02 ENCOUNTER — Ambulatory Visit
Admission: EM | Admit: 2020-03-02 | Discharge: 2020-03-02 | Disposition: A | Discharge status: Home / Self Care | Payer: Commercial Managed Care - PPO | Attending: Family Medicine | Admitting: Family Medicine

## 2020-03-02 ENCOUNTER — Encounter: Payer: Self-pay | Admitting: Emergency Medicine

## 2020-03-02 ENCOUNTER — Other Ambulatory Visit: Payer: Self-pay

## 2020-03-02 DIAGNOSIS — R059 Cough, unspecified: Secondary | ICD-10-CM

## 2020-03-02 DIAGNOSIS — J0110 Acute frontal sinusitis, unspecified: Secondary | ICD-10-CM | POA: Diagnosis not present

## 2020-03-02 DIAGNOSIS — R0981 Nasal congestion: Secondary | ICD-10-CM

## 2020-03-02 MED ORDER — AMOXICILLIN 500 MG PO TABS: 500.0000 mg | ORAL_TABLET | Freq: Two times a day (BID) | ORAL | 0 refills | Status: AC

## 2020-03-02 NOTE — Discharge Instructions (Addendum)
I have sent in amoxicillin for you to take twice a day for 7 days  Follow up with this office or with primary care if symptoms are persisting.  Follow up in the ER for high fever, trouble swallowing, trouble breathing, other concerning symptoms.

## 2020-03-02 NOTE — ED Provider Notes (Signed)
[facility]   [id number] 03/02/20 Arrival Time: 6761  PJ:KDTO THROAT  SUBJECTIVE: History from: patient.  Marcus Frazier is a 59 y.o. male who presents with abrupt onset of nasal congestion, sinus pain, chills, headache, fatigue for the last 2 weeks. Denies sick exposure to Covid, strep, flu or mono, or precipitating event. Has positive history of Covid. Has not had Covid vaccines. Has not had flu vaccine this year. Has tried OTC cough and cold without relief. There are no aggravating symptoms. Denies previous symptoms in the past.     Denies  ear pain, SOB, wheezing, chest pain, nausea, rash, changes in bowel or bladder habits.     ROS: As per HPI.  All other pertinent ROS negative.     Past Medical History:  Diagnosis Date   Back pain    Colon polyps    Diverticula of colon    GERD (gastroesophageal reflux disease)    Herniated disc, cervical    HTN (hypertension)    Kidney stones    Right ankle injury    Seasonal allergies    Past Surgical History:  Procedure Laterality Date   CERVICAL FUSION     C4-C6   COLONOSCOPY  08/20/2004   diverticula   COLONOSCOPY  05/12/2011   Rourk- normal rectum, sigmoid diverticulosis, remainder of colonic mucosa appeared normal.   COLONOSCOPY N/A 08/08/2016   Procedure: COLONOSCOPY;  Surgeon: Daneil Dolin, MD;  Location: [facility];  Service: Endoscopy;  Laterality: N/A;  1030    ESOPHAGOGASTRODUODENOSCOPY N/A 08/08/2016   Procedure: ESOPHAGOGASTRODUODENOSCOPY (EGD);  Surgeon: Daneil Dolin, MD;  Location: [facility];  Service: Endoscopy;  Laterality: N/A;   MALONEY DILATION N/A 08/08/2016   Procedure: Venia Minks DILATION;  Surgeon: Daneil Dolin, MD;  Location: [facility];  Service: Endoscopy;  Laterality: N/A;   No Known Allergies No current facility-administered medications on file prior to encounter.   Current Outpatient Medications on File Prior to Encounter  Medication Sig Dispense Refill    acetaminophen (TYLENOL) 500 MG tablet Take 1,000 mg by mouth every 6 (six) hours as needed for mild pain, moderate pain or fever.     allopurinol (ZYLOPRIM) 300 MG tablet Take 1 tablet (300 mg total) by mouth daily. 90 tablet 1   clobetasol ointment (TEMOVATE) 6.71 % Apply 1 application topically 3 (three) times daily as needed (skin irritation).     Fiber POWD Take 2 scoop by mouth daily.     fluticasone (FLONASE) 50 MCG/ACT nasal spray Place 2 sprays into both nostrils daily. 16 g 1   hydrochlorothiazide (HYDRODIURIL) 25 MG tablet Take 1 tablet (25 mg total) by mouth daily. 90 tablet 1   losartan (COZAAR) 100 MG tablet Take 1 tablet (100 mg total) by mouth daily. 90 tablet 1   pantoprazole (PROTONIX) 40 MG tablet Take 1 tablet by daily 90 tablet 1   Social History   Socioeconomic History   Marital status: Married    Spouse name: Not on file   Number of children: Not on file   Years of education: Not on file   Highest education level: Not on file  Occupational History   Not on file  Tobacco Use   Smoking status: Former Smoker    Packs/day: 1.00    Years: 4.00    Pack years: 4.00    Types: Cigarettes   Smokeless tobacco: Never Used  Substance and Sexual Activity   Alcohol use: No   Drug use: No  Sexual activity: Not on file  Other Topics Concern   Not on file  Social History Narrative   Not on file   Social Determinants of Health   Financial Resource Strain: Not on file  Food Insecurity: Not on file  Transportation Needs: Not on file  Physical Activity: Not on file  Stress: Not on file  Social Connections: Not on file  Intimate Partner Violence: Not on file   Family History  Problem Relation Age of Onset   Hypertension Father    Colon cancer Neg Hx     OBJECTIVE:  Vitals:   03/02/20 1021 03/02/20 1024  BP:  127/89  Pulse:  (!) 110  Resp:  18  Temp:  99.7 F (37.6 C)  TempSrc:  Oral  SpO2:  94%  Weight: 240 lb (108.9 kg)   Height: 5'  10" (1.778 m)      General appearance: alert; appears fatigued, but nontoxic, speaking in full sentences and managing own secretions HEENT: NCAT; Ears: EACs clear, TMs pearly gray with visible cone of light, without erythema; Eyes: PERRL, EOMI grossly; Nose: no obvious rhinorrhea; Throat: oropharynx mildly erythematous, cobblestoning present, tonsils 1+ and mildly erythematous without white tonsillar exudates, uvula midline; Sinuses: frontal sinuses tender to palpation Neck: supple without LAD Lungs: CTA bilaterally without adventitious breath sounds; cough present Heart: regular rate and rhythm.  Radial pulses 2+ symmetrical bilaterally Skin: warm and dry Psychological: alert and cooperative; normal mood and affect  LABS: No results found for this or any previous visit (from the past 24 hour(s)).   ASSESSMENT & PLAN:  1. Acute non-recurrent frontal sinusitis   2. Cough   3. Nasal congestion     Meds ordered this encounter  Medications   amoxicillin (AMOXIL) 500 MG tablet    Sig: Take 1 tablet (500 mg total) by mouth 2 (two) times daily for 7 days.    Dispense:  14 tablet    Refill:  0    Order Specific Question:   Supervising Provider    Answer:   Chase Picket [id number]    Acute Sinusitis Push fluids and get rest Prescribed amoxicillin  Take as directed and to completion.  Drink warm or cool liquids, use throat lozenges, or popsicles to help alleviate symptoms Take OTC ibuprofen or tylenol as needed for pain May use Zyrtec D and flonase to help alleviate symptoms Follow up with PCP if symptoms persist Return or go to ER if you have any new or worsening symptoms such as fever, chills, nausea, vomiting, worsening sore throat, cough, abdominal pain, chest pain, changes in bowel or bladder habits.   Reviewed expectations re: course of current medical issues. Questions answered. Outlined signs and symptoms indicating need for more acute intervention. Patient verbalized  understanding. After Visit Summary given.          Faustino Congress, NP 03/02/20 1053

## 2020-03-02 NOTE — ED Triage Notes (Addendum)
Pt states he thinks he has a sinus infection. He is having sinus congestion and cough x 2 weeks.  Pt states he gets a sinus infection once a year and gets amoxicillin

## 2020-04-04 LAB — CBC WITH DIFFERENTIAL/PLATELET
Basophils Absolute: 0.1 10*3/uL (ref 0.0–0.2)
Basos: 1 %
EOS (ABSOLUTE): 0.2 10*3/uL (ref 0.0–0.4)
Eos: 2 %
Hematocrit: 49.4 % (ref 37.5–51.0)
Hemoglobin: 16.5 g/dL (ref 13.0–17.7)
Immature Grans (Abs): 0.1 10*3/uL (ref 0.0–0.1)
Immature Granulocytes: 1 %
Lymphocytes Absolute: 2.4 10*3/uL (ref 0.7–3.1)
Lymphs: 23 %
MCH: 29.8 pg (ref 26.6–33.0)
MCHC: 33.4 g/dL (ref 31.5–35.7)
MCV: 89 fL (ref 79–97)
Monocytes Absolute: 0.9 10*3/uL (ref 0.1–0.9)
Monocytes: 9 %
Neutrophils Absolute: 6.5 10*3/uL (ref 1.4–7.0)
Neutrophils: 64 %
Platelets: 249 10*3/uL (ref 150–450)
RBC: 5.53 x10E6/uL (ref 4.14–5.80)
RDW: 14 % (ref 11.6–15.4)
WBC: 10.1 10*3/uL (ref 3.4–10.8)

## 2020-04-04 LAB — LIPID PANEL
Chol/HDL Ratio: 4 ratio (ref 0.0–5.0)
Cholesterol, Total: 181 mg/dL (ref 100–199)
HDL: 45 mg/dL (ref >39)
LDL Chol Calc (NIH): 115 mg/dL — ABNORMAL HIGH (ref 0–99)
Triglycerides: 119 mg/dL (ref 0–149)
VLDL Cholesterol Cal: 21 mg/dL (ref 5–40)

## 2020-04-04 LAB — CMP14+EGFR
ALT: 26 IU/L (ref 0–44)
AST: 20 IU/L (ref 0–40)
Albumin/Globulin Ratio: 1.8 (ref 1.2–2.2)
Albumin: 4.6 g/dL (ref 3.8–4.9)
Alkaline Phosphatase: 92 IU/L (ref 44–121)
BUN/Creatinine Ratio: 12 (ref 9–20)
BUN: 14 mg/dL (ref 6–24)
Bilirubin Total: 1 mg/dL (ref 0.0–1.2)
CO2: 26 mmol/L (ref 20–29)
Calcium: 9.7 mg/dL (ref 8.7–10.2)
Chloride: 100 mmol/L (ref 96–106)
Creatinine, Ser: 1.15 mg/dL (ref 0.76–1.27)
GFR calc Af Amer: 81 mL/min/{1.73_m2} (ref >59)
GFR calc non Af Amer: 70 mL/min/{1.73_m2} (ref >59)
Globulin, Total: 2.5 g/dL (ref 1.5–4.5)
Glucose: 94 mg/dL (ref 65–99)
Potassium: 3.8 mmol/L (ref 3.5–5.2)
Sodium: 140 mmol/L (ref 134–144)
Total Protein: 7.1 g/dL (ref 6.0–8.5)

## 2020-04-04 LAB — PSA: Prostate Specific Ag, Serum: 0.4 ng/mL (ref 0.0–4.0)

## 2020-04-24 ENCOUNTER — Telehealth (INDEPENDENT_AMBULATORY_CARE_PROVIDER_SITE_OTHER): Payer: Commercial Managed Care - PPO | Admitting: Family Medicine

## 2020-04-24 ENCOUNTER — Telehealth: Payer: Self-pay | Admitting: *Deleted

## 2020-04-24 ENCOUNTER — Other Ambulatory Visit: Payer: Self-pay

## 2020-04-24 DIAGNOSIS — U071 COVID-19: Secondary | ICD-10-CM

## 2020-04-24 MED ORDER — MOLNUPIRAVIR EUA 200MG CAPSULE: 4.0000 | ORAL_CAPSULE | Freq: Two times a day (BID) | ORAL | 0 refills | Status: AC

## 2020-04-24 NOTE — Progress Notes (Signed)
   Subjective:    Patient ID: Marcus Frazier, male    DOB: 1960/05/13, 61 y.o.   MRN: [id number]  HPI  Patient calls to discuss Positive Covid result. Patient has cough and sinus congestion. Patient relates mainly head congestion scratchy throat denies wheezing difficulty breathing.  Does have some risk factors including weight and hypertension.  Patient denies any vomiting diarrhea denies shortness of breath.  Energy level fair Virtual Visit via Telephone Note  I connected with Marcus Frazier on 04/24/20 at  4:10 PM EST by telephone and verified that I am speaking with the correct person using two identifiers.  Location: Patient: home Provider: office   I discussed the limitations, risks, security and privacy concerns of performing an evaluation and management service by telephone and the availability of in person appointments. I also discussed with the patient that there may be a patient responsible charge related to this service. The patient expressed understanding and agreed to proceed.   History of Present Illness:    Observations/Objective:   Assessment and Plan:   Follow Up Instructions:    I discussed the assessment and treatment plan with the patient. The patient was provided an opportunity to ask questions and all were answered. The patient agreed with the plan and demonstrated an understanding of the instructions.   The patient was advised to call back or seek an in-person evaluation if the symptoms worsen or if the condition fails to improve as anticipated.  I provided 18 minutes of non-face-to-face time during this encounter.     Review of Systems Please see above    Objective:   Physical Exam Does not sound out of breath with our discussion and talks in full sentences without seeming out of breath       Assessment & Plan:  Covid positive We did discuss options including just monitoring I believe it is in the patient's best interest to go ahead and  start a medication moluniprivir was called in.  Proper way to take it was discussed.  Warning signs regarding Covid was discussed in detail.  If patient worsens over the next 48 hours recommend in person visit When to go to ER was discussed as well.  Stay isolated from others for a minimum of 5 days

## 2020-04-24 NOTE — Telephone Encounter (Signed)
Mr. Marcus Frazier, phimmasone are scheduled for a virtual visit with your provider today.    Just as we do with appointments in the office, we must obtain your consent to participate.  Your consent will be active for this visit and any virtual visit you may have with one of our providers in the next 365 days.    If you have a MyChart account, I can also send a copy of this consent to you electronically.  All virtual visits are billed to your insurance company just like a traditional visit in the office.  As this is a virtual visit, video technology does not allow for your provider to perform a traditional examination.  This may limit your provider's ability to fully assess your condition.  If your provider identifies any concerns that need to be evaluated in person or the need to arrange testing such as labs, EKG, etc, we will make arrangements to do so.    Although advances in technology are sophisticated, we cannot ensure that it will always work on either your end or our end.  If the connection with a video visit is poor, we may have to switch to a telephone visit.  With either a video or telephone visit, we are not always able to ensure that we have a secure connection.   I need to obtain your verbal consent now.   Are you willing to proceed with your visit today?   [name] has provided verbal consent on 04/24/2020 for a virtual visit (video or telephone).

## 2020-08-20 ENCOUNTER — Other Ambulatory Visit: Payer: Self-pay | Admitting: Family Medicine

## 2020-08-20 ENCOUNTER — Telehealth: Payer: Self-pay

## 2020-08-20 MED ORDER — ALLOPURINOL 300 MG PO TABS: 300.0000 mg | ORAL_TABLET | Freq: Every day | ORAL | 0 refills | Status: DC

## 2020-08-20 MED ORDER — PANTOPRAZOLE SODIUM 40 MG PO TBEC: DELAYED_RELEASE_TABLET | ORAL | 0 refills | Status: DC

## 2020-08-20 MED ORDER — HYDROCHLOROTHIAZIDE 25 MG PO TABS: 25.0000 mg | ORAL_TABLET | Freq: Every day | ORAL | 0 refills | Status: DC

## 2020-08-20 MED ORDER — LOSARTAN POTASSIUM 100 MG PO TABS: 100.0000 mg | ORAL_TABLET | Freq: Every day | ORAL | 0 refills | Status: DC

## 2020-08-20 NOTE — Telephone Encounter (Signed)
Pt need refill on allopurinol (ZYLOPRIM) 300 MG tablet , hydrochlorothiazide (HYDRODIURIL) 25 MG tablet , losartan (COZAAR) 100 MG tablet , pantoprazole (PROTONIX) 40 MG tablet [place] [place]   Pt call back [phone] Pt made med check appt

## 2020-08-20 NOTE — Telephone Encounter (Signed)
Prescriptions sent electronically to pharmacy. Patient notified. °

## 2020-08-31 ENCOUNTER — Ambulatory Visit (INDEPENDENT_AMBULATORY_CARE_PROVIDER_SITE_OTHER): Payer: Commercial Managed Care - PPO | Admitting: Family Medicine

## 2020-08-31 ENCOUNTER — Other Ambulatory Visit: Payer: Self-pay

## 2020-08-31 VITALS — BP 130/84 | HR 68 | Temp 97.3°F | Wt 245.4 lb

## 2020-08-31 DIAGNOSIS — M1000 Idiopathic gout, unspecified site: Secondary | ICD-10-CM | POA: Diagnosis not present

## 2020-08-31 DIAGNOSIS — K2100 Gastro-esophageal reflux disease with esophagitis, without bleeding: Secondary | ICD-10-CM | POA: Diagnosis not present

## 2020-08-31 DIAGNOSIS — I10 Essential (primary) hypertension: Secondary | ICD-10-CM

## 2020-08-31 MED ORDER — ALLOPURINOL 300 MG PO TABS
300.0000 mg | ORAL_TABLET | Freq: Every day | ORAL | 1 refills | Status: DC
Start: 2020-08-31 — End: 2021-07-11

## 2020-08-31 MED ORDER — LOSARTAN POTASSIUM 100 MG PO TABS: 100.0000 mg | ORAL_TABLET | Freq: Every day | ORAL | 1 refills | Status: DC

## 2020-08-31 MED ORDER — HYDROCHLOROTHIAZIDE 25 MG PO TABS: 25.0000 mg | ORAL_TABLET | Freq: Every day | ORAL | 1 refills | Status: DC

## 2020-08-31 MED ORDER — PANTOPRAZOLE SODIUM 40 MG PO TBEC: DELAYED_RELEASE_TABLET | ORAL | 1 refills | Status: DC

## 2020-08-31 NOTE — Progress Notes (Signed)
Patient ID: Marcus Frazier, male    DOB: 04/03/1960, 60 y.o.   MRN: [id number]   Chief Complaint  Patient presents with   Hypertension   Subjective:    HPI Pt here for HTN check up. Blood pressure has been doing well. No issues. Pt does check blood pressure once in a while.   HTN Pt compliant with BP meds.  No SEs Denies chest pain, sob, LE swelling, or blurry vision.  H/o gout- and doing well the allopurinol.  Jerrye Bushy- doing well.  Taking protonix.  Working well.   Medical History Maddoxx has a past medical history of Back pain, Colon polyps, Diverticula of colon, GERD (gastroesophageal reflux disease), Herniated disc, cervical, HTN (hypertension), Kidney stones, Right ankle injury, and Seasonal allergies.   Outpatient Encounter Medications as of 08/31/2020  Medication Sig   acetaminophen (TYLENOL) 500 MG tablet Take 1,000 mg by mouth every 6 (six) hours as needed for mild pain, moderate pain or fever.   clobetasol ointment (TEMOVATE) 4.09 % Apply 1 application topically 3 (three) times daily as needed (skin irritation).   Fiber POWD Take 2 scoop by mouth daily.   fluticasone (FLONASE) 50 MCG/ACT nasal spray Place 2 sprays into both nostrils daily.   [DISCONTINUED] allopurinol (ZYLOPRIM) 300 MG tablet Take 1 tablet (300 mg total) by mouth daily.   [DISCONTINUED] hydrochlorothiazide (HYDRODIURIL) 25 MG tablet Take 1 tablet (25 mg total) by mouth daily.   [DISCONTINUED] losartan (COZAAR) 100 MG tablet Take 1 tablet (100 mg total) by mouth daily.   [DISCONTINUED] pantoprazole (PROTONIX) 40 MG tablet Take 1 tablet by daily   allopurinol (ZYLOPRIM) 300 MG tablet Take 1 tablet (300 mg total) by mouth daily.   hydrochlorothiazide (HYDRODIURIL) 25 MG tablet Take 1 tablet (25 mg total) by mouth daily.   losartan (COZAAR) 100 MG tablet Take 1 tablet (100 mg total) by mouth daily.   pantoprazole (PROTONIX) 40 MG tablet Take 1 tablet by daily   No facility-administered encounter  medications on file as of 08/31/2020.     Review of Systems  Constitutional:  Negative for chills and fever.  HENT:  Negative for congestion, rhinorrhea and sore throat.   Respiratory:  Negative for cough, shortness of breath and wheezing.   Cardiovascular:  Negative for chest pain and leg swelling.  Gastrointestinal:  Negative for abdominal pain, diarrhea, nausea and vomiting.  Genitourinary:  Negative for dysuria and frequency.  Skin:  Negative for rash.  Neurological:  Negative for dizziness, weakness and headaches.    Vitals BP 130/84   Pulse 68   Temp (!) 97.3 F (36.3 C)   Wt 245 lb 6.4 oz (111.3 kg)   SpO2 98%   BMI 35.21 kg/m   Objective:   Physical Exam Vitals and nursing note reviewed.  Constitutional:      General: He is not in acute distress.    Appearance: Normal appearance. He is not ill-appearing.  HENT:     Head: Normocephalic.     Nose: Nose normal. No congestion.     Mouth/Throat:     Mouth: Mucous membranes are moist.     Pharynx: No oropharyngeal exudate.  Eyes:     Extraocular Movements: Extraocular movements intact.     Conjunctiva/sclera: Conjunctivae normal.     Pupils: Pupils are equal, round, and reactive to light.  Cardiovascular:     Rate and Rhythm: Normal rate and regular rhythm.     Pulses: Normal pulses.     Heart  sounds: Normal heart sounds. No murmur heard. Pulmonary:     Effort: Pulmonary effort is normal.     Breath sounds: Normal breath sounds. No wheezing, rhonchi or rales.  Musculoskeletal:        General: Normal range of motion.     Right lower leg: No edema.     Left lower leg: No edema.  Skin:    General: Skin is warm and dry.     Findings: No rash.  Neurological:     General: No focal deficit present.     Mental Status: He is alert and oriented to person, place, and time.     Cranial Nerves: No cranial nerve deficit.  Psychiatric:        Mood and Affect: Mood normal.        Behavior: Behavior normal.         Thought Content: Thought content normal.        Judgment: Judgment normal.     Assessment and Plan   1. Primary hypertension - hydrochlorothiazide (HYDRODIURIL) 25 MG tablet; Take 1 tablet (25 mg total) by mouth daily.  Dispense: 90 tablet; Refill: 1 - losartan (COZAAR) 100 MG tablet; Take 1 tablet (100 mg total) by mouth daily.  Dispense: 90 tablet; Refill: 1  2. Gastroesophageal reflux disease with esophagitis, unspecified whether hemorrhage - pantoprazole (PROTONIX) 40 MG tablet; Take 1 tablet by daily  Dispense: 90 tablet; Refill: 1  3. Idiopathic gout, unspecified chronicity, unspecified site - allopurinol (ZYLOPRIM) 300 MG tablet; Take 1 tablet (300 mg total) by mouth daily.  Dispense: 90 tablet; Refill: 1   Htn- stable. Cont meds  labs reviewed from 1/22.  All stable.   Gerd- stable. Cont meds.  Gout- stable. Cont meds.  Labs on next visit in 68mo.  Return in about 6 months (around 03/02/2021) for f/u htn, gout, gerd.

## 2021-04-15 ENCOUNTER — Ambulatory Visit
Admission: RE | Admit: 2021-04-15 | Discharge: 2021-04-15 | Disposition: A | Discharge status: Home / Self Care | Payer: Commercial Managed Care - PPO | Source: Ambulatory Visit | Attending: Family Medicine | Admitting: Family Medicine

## 2021-04-15 ENCOUNTER — Other Ambulatory Visit: Payer: Self-pay

## 2021-04-15 VITALS — BP 157/103 | HR 77 | Temp 98.0°F | Resp 18

## 2021-04-15 DIAGNOSIS — J3089 Other allergic rhinitis: Secondary | ICD-10-CM | POA: Diagnosis not present

## 2021-04-15 MED ORDER — AMOXICILLIN-POT CLAVULANATE 875-125 MG PO TABS: 1.0000 | ORAL_TABLET | Freq: Two times a day (BID) | ORAL | 0 refills | Status: DC

## 2021-04-15 NOTE — Discharge Instructions (Signed)
Take an antihistamine daily and use Flonase twice daily.  Sinus rinses, Mucinex, DayQuil, NyQuil as needed for symptoms.  If worsening or not improving over the next 4 to 7 days may start the antibiotic sent to the pharmacy.

## 2021-04-15 NOTE — ED Triage Notes (Signed)
Pt reports cough, nasal congestion, postnasal drip and body aches x 4 days.

## 2021-04-15 NOTE — ED Provider Notes (Signed)
[facility]    CSN: [id number] Arrival date & time: 04/15/21  1144      History   Chief Complaint Chief Complaint  Patient presents with   Appointment    1200   Cough   Nasal Congestion         HPI Marcus Frazier is a 61 y.o. male.   Sent in today with 4-day history of cough, nasal congestion, body aches, postnasal drainage, sinus pain and pressure.  Denies fever, chills, chest pain, shortness of breath, abdominal pain, nausea vomiting diarrhea, sick contacts.  He states this feels exactly like all of his typical sinus infections which he gets multiple times a year typically.  So far taking DayQuil, NyQuil with mild temporary relief of symptoms.  Unsure if he has seasonal allergies, sometimes takes Claritin but has not in quite some time.  States he always gets amoxicillin to help with these infections.   Past Medical History:  Diagnosis Date   Back pain    Colon polyps    Diverticula of colon    GERD (gastroesophageal reflux disease)    Herniated disc, cervical    HTN (hypertension)    Kidney stones    Right ankle injury    Seasonal allergies     Patient Active Problem List   Diagnosis Date Noted   Hypertension 09/18/2016   Reflux esophagitis 09/18/2016   Dysphagia 07/11/2016   Chronic LLQ pain 07/11/2016   Hx of adenomatous colonic polyps 07/11/2016   Unspecified constipation 01/18/2013   Anorectal pain 01/18/2013   Rectal bleeding 01/18/2013   Colon polyp 04/22/2011    Past Surgical History:  Procedure Laterality Date   CERVICAL FUSION     C4-C6   COLONOSCOPY  08/20/2004   diverticula   COLONOSCOPY  05/12/2011   Rourk- normal rectum, sigmoid diverticulosis, remainder of colonic mucosa appeared normal.   COLONOSCOPY N/A 08/08/2016   Procedure: COLONOSCOPY;  Surgeon: Daneil Dolin, MD;  Location: [facility];  Service: Endoscopy;  Laterality: N/A;  1030    ESOPHAGOGASTRODUODENOSCOPY N/A 08/08/2016   Procedure: ESOPHAGOGASTRODUODENOSCOPY  (EGD);  Surgeon: Daneil Dolin, MD;  Location: [facility];  Service: Endoscopy;  Laterality: N/A;   MALONEY DILATION N/A 08/08/2016   Procedure: Venia Minks DILATION;  Surgeon: Daneil Dolin, MD;  Location: [facility];  Service: Endoscopy;  Laterality: N/A;       Home Medications    Prior to Admission medications   Medication Sig Start Date End Date Taking? Authorizing Provider  amoxicillin-clavulanate (AUGMENTIN) 875-125 MG tablet Take 1 tablet by mouth every 12 (twelve) hours. 04/15/21  Yes Volney American, PA-C  acetaminophen (TYLENOL) 500 MG tablet Take 1,000 mg by mouth every 6 (six) hours as needed for mild pain, moderate pain or fever.    [provider]  allopurinol (ZYLOPRIM) 300 MG tablet Take 1 tablet (300 mg total) by mouth daily. 08/31/20   Elvia Collum M, DO  clobetasol ointment (TEMOVATE) 4.81 % Apply 1 application topically 3 (three) times daily as needed (skin irritation).    [provider]  Fiber POWD Take 2 scoop by mouth daily.    [provider]  fluticasone (FLONASE) 50 MCG/ACT nasal spray Place 2 sprays into both nostrils daily. 02/01/20   Lovena Le, Malena M, DO  hydrochlorothiazide (HYDRODIURIL) 25 MG tablet Take 1 tablet (25 mg total) by mouth daily. 08/31/20   Elvia Collum M, DO  losartan (COZAAR) 100 MG tablet Take 1 tablet (100 mg total)  by mouth daily. 08/31/20   Erven Colla, DO  pantoprazole (PROTONIX) 40 MG tablet Take 1 tablet by daily 08/31/20   Erven Colla, DO    Family History Family History  Problem Relation Age of Onset   Hypertension Father    Colon cancer Neg Hx     Social History Social History   Tobacco Use   Smoking status: Former    Packs/day: 1.00    Years: 4.00    Pack years: 4.00    Types: Cigarettes   Smokeless tobacco: Never  Substance Use Topics   Alcohol use: No   Drug use: No     Allergies   Patient has no known allergies.   Review of Systems Review of Systems Per  HPI  Physical Exam Triage Vital Signs ED Triage Vitals  Enc Vitals Group     BP 04/15/21 1250 (!) 157/103     Pulse Rate 04/15/21 1250 77     Resp 04/15/21 1250 18     Temp 04/15/21 1250 98 F (36.7 C)     Temp Source 04/15/21 1250 Oral     SpO2 04/15/21 1250 95 %     Weight --      Height --      Head Circumference --      Peak Flow --      Pain Score 04/15/21 1249 0     Pain Loc --      Pain Edu? --      Excl. in [place]? --    No data found.  Updated Vital Signs BP (!) 157/103 (BP Location: Right Arm)    Pulse 77    Temp 98 F (36.7 C) (Oral)    Resp 18    SpO2 95%   Visual Acuity Right Eye Distance:   Left Eye Distance:   Bilateral Distance:    Right Eye Near:   Left Eye Near:    Bilateral Near:     Physical Exam Vitals and nursing note reviewed.  Constitutional:      Appearance: Normal appearance.  HENT:     Head: Atraumatic.     Left Ear: Tympanic membrane normal.     Nose: Rhinorrhea present.     Mouth/Throat:     Mouth: Mucous membranes are moist.     Pharynx: Posterior oropharyngeal erythema present. No oropharyngeal exudate.  Eyes:     Extraocular Movements: Extraocular movements intact.     Conjunctiva/sclera: Conjunctivae normal.  Cardiovascular:     Rate and Rhythm: Normal rate and regular rhythm.  Pulmonary:     Effort: Pulmonary effort is normal.     Breath sounds: Normal breath sounds. No wheezing or rales.  Musculoskeletal:        General: Normal range of motion.     Cervical back: Normal range of motion and neck supple.  Skin:    General: Skin is warm and dry.  Neurological:     General: No focal deficit present.     Mental Status: He is oriented to person, place, and time.  Psychiatric:        Mood and Affect: Mood normal.        Thought Content: Thought content normal.        Judgment: Judgment normal.     UC Treatments / Results  Labs (all labs ordered are listed, but only abnormal results are displayed) Labs Reviewed - No data  to display  EKG   Radiology No results found.  Procedures Procedures (including critical care time)  Medications Ordered in UC Medications - No data to display  Initial Impression / Assessment and Plan / UC Course  I have reviewed the triage vital signs and the nursing notes.  Pertinent labs & imaging results that were available during my care of the patient were reviewed by me and considered in my medical decision making (see chart for details).     Viral versus allergic sinusitis, discussed supportive over-the-counter medications, home care and if worsening or not resolving over the next 4 to 7 days may start the Augmentin sent.  Return for worsening symptoms.  Declines viral testing today.  Final Clinical Impressions(s) / UC Diagnoses   Final diagnoses:  Non-seasonal allergic rhinitis due to other allergic trigger     Discharge Instructions      Take an antihistamine daily and use Flonase twice daily.  Sinus rinses, Mucinex, DayQuil, NyQuil as needed for symptoms.  If worsening or not improving over the next 4 to 7 days may start the antibiotic sent to the pharmacy.    ED Prescriptions     Medication Sig Dispense Auth. Provider   amoxicillin-clavulanate (AUGMENTIN) 875-125 MG tablet Take 1 tablet by mouth every 12 (twelve) hours. 14 tablet Volney American, [place]      PDMP not reviewed this encounter.   Volney American, [place] 04/15/21 1337

## 2021-07-08 ENCOUNTER — Other Ambulatory Visit: Payer: Self-pay | Admitting: Family Medicine

## 2021-07-08 DIAGNOSIS — M1000 Idiopathic gout, unspecified site: Secondary | ICD-10-CM

## 2021-07-08 DIAGNOSIS — K2100 Gastro-esophageal reflux disease with esophagitis, without bleeding: Secondary | ICD-10-CM

## 2021-07-08 DIAGNOSIS — I10 Essential (primary) hypertension: Secondary | ICD-10-CM

## 2021-07-08 NOTE — Telephone Encounter (Signed)
Sent my chart message to schedule appointment.

## 2021-07-11 ENCOUNTER — Ambulatory Visit (INDEPENDENT_AMBULATORY_CARE_PROVIDER_SITE_OTHER): Payer: Commercial Managed Care - PPO | Admitting: Family Medicine

## 2021-07-11 VITALS — BP 127/89 | HR 85 | Temp 98.6°F | Ht 70.0 in | Wt 251.4 lb

## 2021-07-11 DIAGNOSIS — I10 Essential (primary) hypertension: Secondary | ICD-10-CM | POA: Diagnosis not present

## 2021-07-11 DIAGNOSIS — Z125 Encounter for screening for malignant neoplasm of prostate: Secondary | ICD-10-CM | POA: Diagnosis not present

## 2021-07-11 DIAGNOSIS — E785 Hyperlipidemia, unspecified: Secondary | ICD-10-CM | POA: Diagnosis not present

## 2021-07-11 DIAGNOSIS — K219 Gastro-esophageal reflux disease without esophagitis: Secondary | ICD-10-CM

## 2021-07-11 DIAGNOSIS — Z130 Encounter for screening for diseases of the blood and blood-forming organs and certain disorders involving the immune mechanism: Secondary | ICD-10-CM

## 2021-07-11 DIAGNOSIS — M1000 Idiopathic gout, unspecified site: Secondary | ICD-10-CM

## 2021-07-11 DIAGNOSIS — K2100 Gastro-esophageal reflux disease with esophagitis, without bleeding: Secondary | ICD-10-CM

## 2021-07-11 MED ORDER — HYDROCHLOROTHIAZIDE 25 MG PO TABS: 25.0000 mg | ORAL_TABLET | Freq: Every day | ORAL | 1 refills | Status: DC

## 2021-07-11 MED ORDER — LOSARTAN POTASSIUM 100 MG PO TABS: 100.0000 mg | ORAL_TABLET | Freq: Every day | ORAL | 1 refills | Status: DC

## 2021-07-11 MED ORDER — PANTOPRAZOLE SODIUM 40 MG PO TBEC: DELAYED_RELEASE_TABLET | ORAL | 1 refills | Status: DC

## 2021-07-11 MED ORDER — ALLOPURINOL 300 MG PO TABS: 300.0000 mg | ORAL_TABLET | Freq: Every day | ORAL | 1 refills | Status: DC

## 2021-07-11 NOTE — Patient Instructions (Signed)
Labs ordered. ? ?Meds refilled. ? ?Follow up in 6 months. ?

## 2021-07-14 DIAGNOSIS — K219 Gastro-esophageal reflux disease without esophagitis: Secondary | ICD-10-CM | POA: Insufficient documentation

## 2021-07-14 DIAGNOSIS — M1000 Idiopathic gout, unspecified site: Secondary | ICD-10-CM | POA: Insufficient documentation

## 2021-07-14 NOTE — Assessment & Plan Note (Signed)
Stable on Protonix.  Continue. 

## 2021-07-14 NOTE — Assessment & Plan Note (Signed)
Stable. Continue Allopurinol.  ?

## 2021-07-14 NOTE — Progress Notes (Signed)
? ?Subjective:  ?Patient ID: Marcus Frazier, male    DOB: Aug 14, 1960  Age: 61 y.o. MRN: [id number] ? ?CC: ?Chief Complaint  ?Patient presents with  ? Medication Refill  ?  Needs refills on all medication  ? ? ?HPI: ? ?61 year old male with GERD, Gout and Hypertension presents for follow up. ? ?Hypertension is stable on HCTZ and Losartan. ? ?GERD stable on Protonix. ? ?No recent gout flares. Needs labs. ? ?No complaints or concerns at this time. ? ?Patient Active Problem List  ? Diagnosis Date Noted  ? GERD (gastroesophageal reflux disease) 07/14/2021  ? Idiopathic gout 07/14/2021  ? Hypertension 09/18/2016  ? Hx of adenomatous colonic polyps 07/11/2016  ? ? ?Social Hx   ?Social History  ? ?Socioeconomic History  ? Marital status: Married  ?  Spouse name: Not on file  ? Number of children: Not on file  ? Years of education: Not on file  ? Highest education level: Not on file  ?Occupational History  ? Not on file  ?Tobacco Use  ? Smoking status: Former  ?  Packs/day: 1.00  ?  Years: 4.00  ?  Pack years: 4.00  ?  Types: Cigarettes  ? Smokeless tobacco: Never  ?Substance and Sexual Activity  ? Alcohol use: No  ? Drug use: No  ? Sexual activity: Not on file  ?Other Topics Concern  ? Not on file  ?Social History Narrative  ? Not on file  ? ?Social Determinants of Health  ? ?Financial Resource Strain: Not on file  ?Food Insecurity: Not on file  ?Transportation Needs: Not on file  ?Physical Activity: Not on file  ?Stress: Not on file  ?Social Connections: Not on file  ? ? ?Review of Systems  ?Constitutional: Negative.   ?Musculoskeletal: Negative.   ? ?Objective:  ?BP 127/89   Pulse 85   Temp 98.6 ?F (37 ?C) (Oral)   Ht '5\' 10"'  (1.778 m)   Wt 251 lb 6.4 oz (114 kg)   SpO2 95%   BMI 36.07 kg/m?  ? ? ?  07/11/2021  ? 10:58 AM 04/15/2021  ? 12:50 PM 08/31/2020  ? 10:50 AM  ?BP/Weight  ?Systolic BP 594 585 929  ?Diastolic BP 89 244 84  ?Wt. (Lbs) 251.4  245.4  ?BMI 36.07 kg/m2  35.21 kg/m2  ? ? ?Physical Exam ?Vitals and nursing  note reviewed.  ?Constitutional:   ?   Appearance: Normal appearance. He is obese.  ?HENT:  ?   Head: Normocephalic and atraumatic.  ?Eyes:  ?   General:     ?   Right eye: No discharge.     ?   Left eye: No discharge.  ?   Conjunctiva/sclera: Conjunctivae normal.  ?Cardiovascular:  ?   Rate and Rhythm: Normal rate and regular rhythm.  ?   Heart sounds: No murmur heard. ?Pulmonary:  ?   Effort: Pulmonary effort is normal.  ?   Breath sounds: Normal breath sounds. No wheezing or rales.  ?Neurological:  ?   Mental Status: He is alert.  ?Psychiatric:     ?   Mood and Affect: Mood normal.     ?   Behavior: Behavior normal.  ? ? ?Lab Results  ?Component Value Date  ? WBC 10.1 04/03/2020  ? HGB 16.5 04/03/2020  ? HCT 49.4 04/03/2020  ? PLT 249 04/03/2020  ? GLUCOSE 94 04/03/2020  ? CHOL 181 04/03/2020  ? TRIG 119 04/03/2020  ? HDL 45 04/03/2020  ?  [place] 115 (H) 04/03/2020  ? ALT 26 04/03/2020  ? AST 20 04/03/2020  ? NA 140 04/03/2020  ? K 3.8 04/03/2020  ? CL 100 04/03/2020  ? CREATININE 1.15 04/03/2020  ? BUN 14 04/03/2020  ? CO2 26 04/03/2020  ? ? ? ?Assessment & Plan:  ? ?Problem List Items Addressed This Visit   ? ?  ? Cardiovascular and Mediastinum  ? Hypertension - Primary  ?  Well controlled. Continue Losartan and HCTZ. ? ?  ?  ? Relevant Medications  ? hydrochlorothiazide (HYDRODIURIL) 25 MG tablet  ? losartan (COZAAR) 100 MG tablet  ? Other Relevant Orders  ? CMP14+EGFR  ?  ? Digestive  ? GERD (gastroesophageal reflux disease)  ?  Stable on Protonix. Continue.  ? ?  ?  ? Relevant Medications  ? pantoprazole (PROTONIX) 40 MG tablet  ?  ? Other  ? Idiopathic gout  ?  Stable. Continue Allopurinol.  ? ?  ?  ? Relevant Medications  ? allopurinol (ZYLOPRIM) 300 MG tablet  ? ?Other Visit Diagnoses   ? ? Screening for deficiency anemia      ? Relevant Orders  ? CBC  ? Hyperlipidemia, unspecified hyperlipidemia type      ? Relevant Medications  ? hydrochlorothiazide (HYDRODIURIL) 25 MG tablet  ? losartan (COZAAR) 100 MG  tablet  ? Other Relevant Orders  ? Lipid panel  ? Prostate cancer screening      ? Relevant Orders  ? PSA  ? ?  ? ? ?Meds ordered this encounter  ?Medications  ? allopurinol (ZYLOPRIM) 300 MG tablet  ?  Sig: Take 1 tablet (300 mg total) by mouth daily.  ?  Dispense:  90 tablet  ?  Refill:  1  ? hydrochlorothiazide (HYDRODIURIL) 25 MG tablet  ?  Sig: Take 1 tablet (25 mg total) by mouth daily.  ?  Dispense:  90 tablet  ?  Refill:  1  ? losartan (COZAAR) 100 MG tablet  ?  Sig: Take 1 tablet (100 mg total) by mouth daily.  ?  Dispense:  90 tablet  ?  Refill:  1  ? pantoprazole (PROTONIX) 40 MG tablet  ?  Sig: Take 1 tablet by daily  ?  Dispense:  90 tablet  ?  Refill:  1  ? ? ?Follow-up:  Return in about 6 months (around 01/11/2022). ? ?Thersa Salt DO ?[place] ? ?

## 2021-07-14 NOTE — Assessment & Plan Note (Signed)
Well controlled. Continue Losartan and HCTZ. ?

## 2021-07-15 ENCOUNTER — Encounter: Payer: Self-pay | Admitting: *Deleted

## 2021-07-15 NOTE — Telephone Encounter (Signed)
Patient had appointment on 5/4 for medication follow up ?

## 2022-01-28 LAB — CMP14+EGFR
ALT: 45 IU/L — ABNORMAL HIGH (ref 0–44)
AST: 29 IU/L (ref 0–40)
Albumin/Globulin Ratio: 2.1 (ref 1.2–2.2)
Albumin: 4.6 g/dL (ref 3.8–4.9)
Alkaline Phosphatase: 89 IU/L (ref 44–121)
BUN/Creatinine Ratio: 10 (ref 10–24)
BUN: 11 mg/dL (ref 8–27)
Bilirubin Total: 1.9 mg/dL — ABNORMAL HIGH (ref 0.0–1.2)
CO2: 25 mmol/L (ref 20–29)
Calcium: 9.7 mg/dL (ref 8.6–10.2)
Chloride: 102 mmol/L (ref 96–106)
Creatinine, Ser: 1.14 mg/dL (ref 0.76–1.27)
Globulin, Total: 2.2 g/dL (ref 1.5–4.5)
Glucose: 154 mg/dL — ABNORMAL HIGH (ref 70–99)
Potassium: 3.7 mmol/L (ref 3.5–5.2)
Sodium: 142 mmol/L (ref 134–144)
Total Protein: 6.8 g/dL (ref 6.0–8.5)
eGFR: 74 mL/min/{1.73_m2} (ref >59)

## 2022-01-28 LAB — LIPID PANEL
Chol/HDL Ratio: 4 ratio (ref 0.0–5.0)
Cholesterol, Total: 176 mg/dL (ref 100–199)
HDL: 44 mg/dL (ref >39)
LDL Chol Calc (NIH): 117 mg/dL — ABNORMAL HIGH (ref 0–99)
Triglycerides: 79 mg/dL (ref 0–149)
VLDL Cholesterol Cal: 15 mg/dL (ref 5–40)

## 2022-01-28 LAB — CBC
Hematocrit: 48.3 % (ref 37.5–51.0)
Hemoglobin: 16.7 g/dL (ref 13.0–17.7)
MCH: 30.6 pg (ref 26.6–33.0)
MCHC: 34.6 g/dL (ref 31.5–35.7)
MCV: 89 fL (ref 79–97)
Platelets: 251 10*3/uL (ref 150–450)
RBC: 5.46 x10E6/uL (ref 4.14–5.80)
RDW: 12.4 % (ref 11.6–15.4)
WBC: 9 10*3/uL (ref 3.4–10.8)

## 2022-01-28 LAB — PSA: Prostate Specific Ag, Serum: 0.4 ng/mL (ref 0.0–4.0)

## 2022-02-07 ENCOUNTER — Encounter: Payer: Self-pay | Admitting: Family Medicine

## 2022-02-07 ENCOUNTER — Ambulatory Visit (INDEPENDENT_AMBULATORY_CARE_PROVIDER_SITE_OTHER): Payer: Commercial Managed Care - PPO | Admitting: Family Medicine

## 2022-02-07 VITALS — BP 128/82 | HR 70 | Temp 98.1°F | Ht 70.0 in | Wt 253.4 lb

## 2022-02-07 DIAGNOSIS — K219 Gastro-esophageal reflux disease without esophagitis: Secondary | ICD-10-CM | POA: Diagnosis not present

## 2022-02-07 DIAGNOSIS — E785 Hyperlipidemia, unspecified: Secondary | ICD-10-CM | POA: Insufficient documentation

## 2022-02-07 DIAGNOSIS — Z23 Encounter for immunization: Secondary | ICD-10-CM

## 2022-02-07 DIAGNOSIS — M1000 Idiopathic gout, unspecified site: Secondary | ICD-10-CM

## 2022-02-07 DIAGNOSIS — I10 Essential (primary) hypertension: Secondary | ICD-10-CM | POA: Diagnosis not present

## 2022-02-07 DIAGNOSIS — E7800 Pure hypercholesterolemia, unspecified: Secondary | ICD-10-CM | POA: Diagnosis not present

## 2022-02-07 MED ORDER — PANTOPRAZOLE SODIUM 40 MG PO TBEC: DELAYED_RELEASE_TABLET | ORAL | 3 refills | Status: DC

## 2022-02-07 MED ORDER — HYDROCHLOROTHIAZIDE 25 MG PO TABS: 25.0000 mg | ORAL_TABLET | Freq: Every day | ORAL | 3 refills | Status: DC

## 2022-02-07 MED ORDER — LOSARTAN POTASSIUM 100 MG PO TABS: 100.0000 mg | ORAL_TABLET | Freq: Every day | ORAL | 3 refills | Status: DC

## 2022-02-07 MED ORDER — ALLOPURINOL 300 MG PO TABS: 300.0000 mg | ORAL_TABLET | Freq: Every day | ORAL | 3 refills | Status: DC

## 2022-02-07 NOTE — Patient Instructions (Signed)
Meds refilled.  Labs prior to visit in 6 months.  Take care  Dr. Lacinda Axon

## 2022-02-10 NOTE — Assessment & Plan Note (Signed)
Stable on allopurinol.  No recent flares.  Continue.

## 2022-02-10 NOTE — Progress Notes (Signed)
Subjective:  Patient ID: Marcus Frazier, male    DOB: 1960-06-07  Age: 61 y.o. MRN: [id number]  CC: Chief Complaint  Patient presents with   Follow-up    Discuss medications     HPI:  61 year old male with hypertension, obesity, GERD, gout, hyperlipidemia presents for follow-up.  Patient's blood pressure is well-controlled on losartan and HCTZ.  Gout is well-controlled on allopurinol.  No recent flares.  Patient's hyperlipidemia is not at goal.  Most recent LDL 117.  Will discuss this today as well as the possibility of lipid-lowering therapy.  Additionally, suspect patient is prediabetic.  Patient's glucose on his blood work was 154.  Will discuss this today as well.  Patient Active Problem List   Diagnosis Date Noted   Hyperlipidemia 02/07/2022   GERD (gastroesophageal reflux disease) 07/14/2021   Idiopathic gout 07/14/2021   Hypertension 09/18/2016   Hx of adenomatous colonic polyps 07/11/2016    Social Hx   Social History   Socioeconomic History   Marital status: Married    Spouse name: Not on file   Number of children: Not on file   Years of education: Not on file   Highest education level: Not on file  Occupational History   Not on file  Tobacco Use   Smoking status: Former    Packs/day: 1.00    Years: 4.00    Total pack years: 4.00    Types: Cigarettes   Smokeless tobacco: Never  Substance and Sexual Activity   Alcohol use: No   Drug use: No   Sexual activity: Not on file  Other Topics Concern   Not on file  Social History Narrative   Not on file   Social Determinants of Health   Financial Resource Strain: Not on file  Food Insecurity: Not on file  Transportation Needs: Not on file  Physical Activity: Not on file  Stress: Not on file  Social Connections: Not on file    Review of Systems  Constitutional: Negative.   Respiratory: Negative.    Cardiovascular: Negative.    Objective:  BP 128/82   Pulse 70   Temp 98.1 F (36.7 C) (Oral)    Ht '5\' 10"'$  (1.778 m)   Wt 253 lb 6.4 oz (114.9 kg)   SpO2 98%   BMI 36.36 kg/m      02/07/2022    2:02 PM 07/11/2021   10:58 AM 04/15/2021   12:50 PM  BP/Weight  Systolic BP 419 622 297  Diastolic BP 82 89 989  Wt. (Lbs) 253.4 251.4   BMI 36.36 kg/m2 36.07 kg/m2     Physical Exam Vitals and nursing note reviewed.  Constitutional:      General: He is not in acute distress.    Appearance: Normal appearance. He is obese.  HENT:     Head: Normocephalic and atraumatic.  Eyes:     General:        Right eye: No discharge.        Left eye: No discharge.     Conjunctiva/sclera: Conjunctivae normal.  Cardiovascular:     Rate and Rhythm: Normal rate and regular rhythm.  Pulmonary:     Effort: Pulmonary effort is normal.     Breath sounds: Normal breath sounds. No wheezing, rhonchi or rales.  Neurological:     Mental Status: He is alert.  Psychiatric:        Mood and Affect: Mood normal.        Behavior: Behavior normal.  Lab Results  Component Value Date   WBC 9.0 01/27/2022   HGB 16.7 01/27/2022   HCT 48.3 01/27/2022   PLT 251 01/27/2022   GLUCOSE 154 (H) 01/27/2022   CHOL 176 01/27/2022   TRIG 79 01/27/2022   HDL 44 01/27/2022   LDLCALC 117 (H) 01/27/2022   ALT 45 (H) 01/27/2022   AST 29 01/27/2022   NA 142 01/27/2022   K 3.7 01/27/2022   CL 102 01/27/2022   CREATININE 1.14 01/27/2022   BUN 11 01/27/2022   CO2 25 01/27/2022     Assessment & Plan:   Problem List Items Addressed This Visit       Cardiovascular and Mediastinum   Hypertension - Primary    At goal.  Continue losartan and HCTZ.      Relevant Medications   hydrochlorothiazide (HYDRODIURIL) 25 MG tablet   losartan (COZAAR) 100 MG tablet     Digestive   GERD (gastroesophageal reflux disease)    GERD stable on Protonix.  Continue.       Relevant Medications   pantoprazole (PROTONIX) 40 MG tablet     Other   Hyperlipidemia    Discussed lipid-lowering therapy.  Patient elects to  wait.  He wants to work on diet and to reassess.      Relevant Medications   hydrochlorothiazide (HYDRODIURIL) 25 MG tablet   losartan (COZAAR) 100 MG tablet   Idiopathic gout    Stable on allopurinol.  No recent flares.  Continue.      Relevant Medications   allopurinol (ZYLOPRIM) 300 MG tablet   Other Visit Diagnoses     Immunization due       Relevant Orders   Tdap vaccine greater than or equal to 7yo IM (Completed)       Meds ordered this encounter  Medications   allopurinol (ZYLOPRIM) 300 MG tablet    Sig: Take 1 tablet (300 mg total) by mouth daily.    Dispense:  90 tablet    Refill:  3   hydrochlorothiazide (HYDRODIURIL) 25 MG tablet    Sig: Take 1 tablet (25 mg total) by mouth daily.    Dispense:  90 tablet    Refill:  3   losartan (COZAAR) 100 MG tablet    Sig: Take 1 tablet (100 mg total) by mouth daily.    Dispense:  90 tablet    Refill:  3   pantoprazole (PROTONIX) 40 MG tablet    Sig: Take 1 tablet by daily    Dispense:  90 tablet    Refill:  3    Follow-up:  Return in about 6 months (around 08/09/2022).  [place]

## 2022-02-10 NOTE — Assessment & Plan Note (Signed)
At goal.  Continue losartan and HCTZ.

## 2022-02-10 NOTE — Assessment & Plan Note (Signed)
Discussed lipid-lowering therapy.  Patient elects to wait.  He wants to work on diet and to reassess.

## 2022-02-10 NOTE — Assessment & Plan Note (Signed)
GERD stable on Protonix.  Continue.

## 2022-08-07 ENCOUNTER — Other Ambulatory Visit: Payer: Self-pay | Admitting: Family Medicine

## 2022-08-07 DIAGNOSIS — I10 Essential (primary) hypertension: Secondary | ICD-10-CM

## 2022-08-07 DIAGNOSIS — Z125 Encounter for screening for malignant neoplasm of prostate: Secondary | ICD-10-CM

## 2022-08-07 DIAGNOSIS — E7800 Pure hypercholesterolemia, unspecified: Secondary | ICD-10-CM

## 2022-08-07 DIAGNOSIS — Z130 Encounter for screening for diseases of the blood and blood-forming organs and certain disorders involving the immune mechanism: Secondary | ICD-10-CM

## 2022-08-08 LAB — CMP14+EGFR
ALT: 33 IU/L (ref 0–44)
AST: 23 IU/L (ref 0–40)
Albumin/Globulin Ratio: 1.7 (ref 1.2–2.2)
Albumin: 4.4 g/dL (ref 3.9–4.9)
Alkaline Phosphatase: 88 IU/L (ref 44–121)
BUN/Creatinine Ratio: 12 (ref 10–24)
BUN: 16 mg/dL (ref 8–27)
Bilirubin Total: 1.4 mg/dL — ABNORMAL HIGH (ref 0.0–1.2)
CO2: 23 mmol/L (ref 20–29)
Calcium: 9.7 mg/dL (ref 8.6–10.2)
Chloride: 101 mmol/L (ref 96–106)
Creatinine, Ser: 1.29 mg/dL — ABNORMAL HIGH (ref 0.76–1.27)
Globulin, Total: 2.6 g/dL (ref 1.5–4.5)
Glucose: 151 mg/dL — ABNORMAL HIGH (ref 70–99)
Potassium: 4.1 mmol/L (ref 3.5–5.2)
Sodium: 142 mmol/L (ref 134–144)
Total Protein: 7 g/dL (ref 6.0–8.5)
eGFR: 63 mL/min/{1.73_m2} (ref >59)

## 2022-08-08 LAB — CBC
Hematocrit: 49.6 % (ref 37.5–51.0)
Hemoglobin: 17.1 g/dL (ref 13.0–17.7)
MCH: 30.8 pg (ref 26.6–33.0)
MCHC: 34.5 g/dL (ref 31.5–35.7)
MCV: 89 fL (ref 79–97)
Platelets: 278 10*3/uL (ref 150–450)
RBC: 5.55 x10E6/uL (ref 4.14–5.80)
RDW: 13.9 % (ref 11.6–15.4)
WBC: 8.6 10*3/uL (ref 3.4–10.8)

## 2022-08-08 LAB — LIPID PANEL
Chol/HDL Ratio: 4.1 ratio (ref 0.0–5.0)
Cholesterol, Total: 186 mg/dL (ref 100–199)
HDL: 45 mg/dL (ref >39)
LDL Chol Calc (NIH): 124 mg/dL — ABNORMAL HIGH (ref 0–99)
Triglycerides: 90 mg/dL (ref 0–149)
VLDL Cholesterol Cal: 17 mg/dL (ref 5–40)

## 2022-08-08 LAB — PSA: Prostate Specific Ag, Serum: 0.3 ng/mL (ref 0.0–4.0)

## 2022-08-11 ENCOUNTER — Ambulatory Visit (INDEPENDENT_AMBULATORY_CARE_PROVIDER_SITE_OTHER): Payer: Commercial Managed Care - PPO | Admitting: Family Medicine

## 2022-08-11 VITALS — BP 128/82 | HR 73 | Temp 98.2°F | Ht 70.0 in | Wt 252.0 lb

## 2022-08-11 DIAGNOSIS — R739 Hyperglycemia, unspecified: Secondary | ICD-10-CM | POA: Diagnosis not present

## 2022-08-11 DIAGNOSIS — M1000 Idiopathic gout, unspecified site: Secondary | ICD-10-CM

## 2022-08-11 DIAGNOSIS — E7800 Pure hypercholesterolemia, unspecified: Secondary | ICD-10-CM

## 2022-08-11 DIAGNOSIS — I10 Essential (primary) hypertension: Secondary | ICD-10-CM

## 2022-08-11 DIAGNOSIS — K219 Gastro-esophageal reflux disease without esophagitis: Secondary | ICD-10-CM

## 2022-08-11 MED ORDER — HYDROCHLOROTHIAZIDE 25 MG PO TABS: 25.0000 mg | ORAL_TABLET | Freq: Every day | ORAL | 3 refills | Status: DC

## 2022-08-11 MED ORDER — ALLOPURINOL 300 MG PO TABS: 300.0000 mg | ORAL_TABLET | Freq: Every day | ORAL | 3 refills | Status: DC

## 2022-08-11 MED ORDER — LOSARTAN POTASSIUM 100 MG PO TABS: 100.0000 mg | ORAL_TABLET | Freq: Every day | ORAL | 3 refills | Status: DC

## 2022-08-11 MED ORDER — PANTOPRAZOLE SODIUM 40 MG PO TBEC: DELAYED_RELEASE_TABLET | ORAL | 3 refills | Status: DC

## 2022-08-11 NOTE — Assessment & Plan Note (Signed)
Discussed lipid-lowering therapy.  Patient to consider.  We discussed CT coronary calcium as a tool for making arguments for or against aggressive lipid-lowering therapy.

## 2022-08-11 NOTE — Patient Instructions (Signed)
Consider CT coronary calcium.  A1C ordered.  Continue your medication.  Follow up in 6 months.

## 2022-08-11 NOTE — Assessment & Plan Note (Signed)
Stable on allopurinol. Continue. 

## 2022-08-11 NOTE — Assessment & Plan Note (Signed)
Well-controlled.  Continue hydrochlorothiazide and losartan.  Refilled today.

## 2022-08-11 NOTE — Assessment & Plan Note (Signed)
Stable on Protonix.  Continue. 

## 2022-08-11 NOTE — Progress Notes (Signed)
Subjective:  Patient ID: Marcus Frazier, male    DOB: 02-13-1961  Age: 62 y.o. MRN: [id number]  CC: Chief Complaint  Patient presents with   Hypertension    6 month follow up    HPI:  62 year old male with Hypertension, GERD, Gout, Hyperlipidemia, Obesity presents for follow-up.  Hypertension is at goal on hydrochlorothiazide and losartan.  GERD stable on Protonix.  No recent gout flares.  Doing well on allopurinol.  Labs reviewed with the patient.  Mildly elevated creatinine.  Glucose elevated 151.  Needs A1c.  Additionally, LDL 124.  Will discuss lipid-lowering therapy.  Patient interested in discussing tools for the early detection of cardiovascular disease.  Patient Active Problem List   Diagnosis Date Noted   Hyperlipidemia 02/07/2022   GERD (gastroesophageal reflux disease) 07/14/2021   Idiopathic gout 07/14/2021   Hypertension 09/18/2016    Social Hx   Social History   Socioeconomic History   Marital status: Married    Spouse name: Not on file   Number of children: Not on file   Years of education: Not on file   Highest education level: Not on file  Occupational History   Not on file  Tobacco Use   Smoking status: Former    Packs/day: 1.00    Years: 4.00    Additional pack years: 0.00    Total pack years: 4.00    Types: Cigarettes   Smokeless tobacco: Never  Substance and Sexual Activity   Alcohol use: No   Drug use: No   Sexual activity: Not on file  Other Topics Concern   Not on file  Social History Narrative   Not on file   Social Determinants of Health   Financial Resource Strain: Not on file  Food Insecurity: Not on file  Transportation Needs: Not on file  Physical Activity: Not on file  Stress: Not on file  Social Connections: Not on file    Review of Systems  Constitutional: Negative.   Respiratory: Negative.    Cardiovascular: Negative.    Objective:  BP 128/82   Pulse 73   Temp 98.2 F (36.8 C)   Ht 5\' 10"  (1.778 m)   Wt  252 lb (114.3 kg)   SpO2 97%   BMI 36.16 kg/m      08/11/2022    2:13 PM 02/07/2022    2:02 PM 07/11/2021   10:58 AM  BP/Weight  Systolic BP 128 128 127  Diastolic BP 82 82 89  Wt. (Lbs) 252 253.4 251.4  BMI 36.16 kg/m2 36.36 kg/m2 36.07 kg/m2    Physical Exam Vitals and nursing note reviewed.  Constitutional:      General: He is not in acute distress.    Appearance: Normal appearance.  HENT:     Head: Normocephalic and atraumatic.  Eyes:     General:        Right eye: No discharge.        Left eye: No discharge.     Conjunctiva/sclera: Conjunctivae normal.  Cardiovascular:     Rate and Rhythm: Normal rate and regular rhythm.  Pulmonary:     Effort: Pulmonary effort is normal.     Breath sounds: Normal breath sounds. No wheezing, rhonchi or rales.  Neurological:     Mental Status: He is alert.  Psychiatric:        Mood and Affect: Mood normal.        Behavior: Behavior normal.     Lab Results  Component Value Date  WBC 8.6 08/07/2022   HGB 17.1 08/07/2022   HCT 49.6 08/07/2022   PLT 278 08/07/2022   GLUCOSE 151 (H) 08/07/2022   CHOL 186 08/07/2022   TRIG 90 08/07/2022   HDL 45 08/07/2022   LDLCALC 124 (H) 08/07/2022   ALT 33 08/07/2022   AST 23 08/07/2022   NA 142 08/07/2022   K 4.1 08/07/2022   CL 101 08/07/2022   CREATININE 1.29 (H) 08/07/2022   BUN 16 08/07/2022   CO2 23 08/07/2022     Assessment & Plan:   Problem List Items Addressed This Visit       Cardiovascular and Mediastinum   Hypertension - Primary    Well-controlled.  Continue hydrochlorothiazide and losartan.  Refilled today.      Relevant Medications   hydrochlorothiazide (HYDRODIURIL) 25 MG tablet   losartan (COZAAR) 100 MG tablet     Digestive   GERD (gastroesophageal reflux disease)    Stable on Protonix.  Continue.      Relevant Medications   pantoprazole (PROTONIX) 40 MG tablet     Other   Idiopathic gout    Stable on allopurinol.  Continue.      Relevant  Medications   allopurinol (ZYLOPRIM) 300 MG tablet   Hyperlipidemia    Discussed lipid-lowering therapy.  Patient to consider.  We discussed CT coronary calcium as a tool for making arguments for or against aggressive lipid-lowering therapy.      Relevant Medications   hydrochlorothiazide (HYDRODIURIL) 25 MG tablet   losartan (COZAAR) 100 MG tablet   Other Visit Diagnoses     Blood glucose elevated       Relevant Orders   Hemoglobin A1c       Meds ordered this encounter  Medications   allopurinol (ZYLOPRIM) 300 MG tablet    Sig: Take 1 tablet (300 mg total) by mouth daily.    Dispense:  90 tablet    Refill:  3   hydrochlorothiazide (HYDRODIURIL) 25 MG tablet    Sig: Take 1 tablet (25 mg total) by mouth daily.    Dispense:  90 tablet    Refill:  3   losartan (COZAAR) 100 MG tablet    Sig: Take 1 tablet (100 mg total) by mouth daily.    Dispense:  90 tablet    Refill:  3   pantoprazole (PROTONIX) 40 MG tablet    Sig: Take 1 tablet by daily    Dispense:  90 tablet    Refill:  3    Follow-up:  Return in about 6 months (around 02/10/2023).  Everlene Other DO [facility]

## 2022-08-12 ENCOUNTER — Telehealth: Payer: Self-pay

## 2022-08-12 ENCOUNTER — Other Ambulatory Visit: Payer: Self-pay | Admitting: Family Medicine

## 2022-08-12 LAB — HEMOGLOBIN A1C
Est. average glucose Bld gHb Est-mCnc: 160 mg/dL
Hgb A1c MFr Bld: 7.2 % — ABNORMAL HIGH (ref 4.8–5.6)

## 2022-08-12 MED ORDER — METFORMIN HCL 500 MG PO TABS: 500.0000 mg | ORAL_TABLET | Freq: Two times a day (BID) | ORAL | 3 refills | Status: DC

## 2022-08-12 NOTE — Telephone Encounter (Signed)
Patient received his lab results, please send new metformin prescription to [facility] pharmacy

## 2022-10-20 LAB — HM DIABETES EYE EXAM

## 2022-10-27 ENCOUNTER — Encounter: Payer: Self-pay | Admitting: *Deleted

## 2023-02-04 ENCOUNTER — Telehealth: Payer: Self-pay | Admitting: *Deleted

## 2023-02-04 DIAGNOSIS — E7800 Pure hypercholesterolemia, unspecified: Secondary | ICD-10-CM

## 2023-02-04 DIAGNOSIS — R739 Hyperglycemia, unspecified: Secondary | ICD-10-CM

## 2023-02-04 NOTE — Telephone Encounter (Signed)
Copied from CRM [id number]. Topic: Clinical - Request for Lab/Test Order >> Feb 04, 2023  9:09 AM Joanette Gula wrote: Reason for CRM: Patient is calling to make sure he's getting his A1C lab work done on 02/10/23. Please call at [phone] to confirm.

## 2023-02-09 ENCOUNTER — Other Ambulatory Visit: Payer: Self-pay | Admitting: Family Medicine

## 2023-02-09 DIAGNOSIS — E119 Type 2 diabetes mellitus without complications: Secondary | ICD-10-CM

## 2023-02-09 DIAGNOSIS — E7800 Pure hypercholesterolemia, unspecified: Secondary | ICD-10-CM

## 2023-02-09 DIAGNOSIS — Z130 Encounter for screening for diseases of the blood and blood-forming organs and certain disorders involving the immune mechanism: Secondary | ICD-10-CM

## 2023-02-09 NOTE — Telephone Encounter (Signed)
Blood work ordered in EPIC 

## 2023-02-09 NOTE — Telephone Encounter (Signed)
Cook, Groes[place] G, DO     Please order CMP, Lipid, and A1C.

## 2023-02-10 ENCOUNTER — Ambulatory Visit (INDEPENDENT_AMBULATORY_CARE_PROVIDER_SITE_OTHER): Payer: Commercial Managed Care - PPO | Admitting: Family Medicine

## 2023-02-10 ENCOUNTER — Encounter: Payer: Self-pay | Admitting: Family Medicine

## 2023-02-10 VITALS — BP 148/95 | HR 67 | Temp 98.2°F | Ht 70.0 in | Wt 248.0 lb

## 2023-02-10 DIAGNOSIS — E1169 Type 2 diabetes mellitus with other specified complication: Secondary | ICD-10-CM

## 2023-02-10 DIAGNOSIS — E119 Type 2 diabetes mellitus without complications: Secondary | ICD-10-CM | POA: Insufficient documentation

## 2023-02-10 DIAGNOSIS — Z7984 Long term (current) use of oral hypoglycemic drugs: Secondary | ICD-10-CM | POA: Diagnosis not present

## 2023-02-10 DIAGNOSIS — E7800 Pure hypercholesterolemia, unspecified: Secondary | ICD-10-CM

## 2023-02-10 DIAGNOSIS — I10 Essential (primary) hypertension: Secondary | ICD-10-CM | POA: Diagnosis not present

## 2023-02-10 LAB — CBC
Hematocrit: 50.6 % (ref 37.5–51.0)
Hemoglobin: 16.6 g/dL (ref 13.0–17.7)
MCH: 30.7 pg (ref 26.6–33.0)
MCHC: 32.8 g/dL (ref 31.5–35.7)
MCV: 94 fL (ref 79–97)
Platelets: 302 10*3/uL (ref 150–450)
RBC: 5.41 x10E6/uL (ref 4.14–5.80)
RDW: 13.8 % (ref 11.6–15.4)
WBC: 7.7 10*3/uL (ref 3.4–10.8)

## 2023-02-10 LAB — CMP14+EGFR
ALT: 33 IU/L (ref 0–44)
AST: 25 IU/L (ref 0–40)
Albumin: 4.7 g/dL (ref 3.9–4.9)
Alkaline Phosphatase: 87 IU/L (ref 44–121)
BUN/Creatinine Ratio: 13 (ref 10–24)
BUN: 14 mg/dL (ref 8–27)
Bilirubin Total: 1.5 mg/dL — ABNORMAL HIGH (ref 0.0–1.2)
CO2: 25 mmol/L (ref 20–29)
Calcium: 10.1 mg/dL (ref 8.6–10.2)
Chloride: 102 mmol/L (ref 96–106)
Creatinine, Ser: 1.08 mg/dL (ref 0.76–1.27)
Globulin, Total: 2.6 g/dL (ref 1.5–4.5)
Glucose: 136 mg/dL — ABNORMAL HIGH (ref 70–99)
Potassium: 4.1 mmol/L (ref 3.5–5.2)
Sodium: 142 mmol/L (ref 134–144)
Total Protein: 7.3 g/dL (ref 6.0–8.5)
eGFR: 78 mL/min/1.73

## 2023-02-10 LAB — MICROALBUMIN / CREATININE URINE RATIO
Creatinine, Urine: 205.8 mg/dL
Microalb/Creat Ratio: 3 mg/g{creat} (ref 0–29)
Microalbumin, Urine: 5.9 ug/mL

## 2023-02-10 LAB — LIPID PANEL
Chol/HDL Ratio: 3.8 ratio (ref 0.0–5.0)
Cholesterol, Total: 166 mg/dL (ref 100–199)
HDL: 44 mg/dL
LDL Chol Calc (NIH): 108 mg/dL — ABNORMAL HIGH (ref 0–99)
Triglycerides: 73 mg/dL (ref 0–149)
VLDL Cholesterol Cal: 14 mg/dL (ref 5–40)

## 2023-02-10 LAB — HEMOGLOBIN A1C
Est. average glucose Bld gHb Est-mCnc: 140 mg/dL
Hgb A1c MFr Bld: 6.5 % — ABNORMAL HIGH (ref 4.8–5.6)

## 2023-02-10 MED ORDER — SEMAGLUTIDE(0.25 OR 0.5MG/DOS) 2 MG/3ML ~~LOC~~ SOPN: PEN_INJECTOR | SUBCUTANEOUS | 1 refills | Status: DC

## 2023-02-10 NOTE — Patient Instructions (Addendum)
Stop Metformin.  Rx sent for Ozempic.  Consider cholesterol medication.  Follow up in 3 months.

## 2023-02-10 NOTE — Assessment & Plan Note (Signed)
Experiencing side effects from treatment.  Not tolerating metformin.  Stopping metformin.  Starting Ozempic.

## 2023-02-10 NOTE — Assessment & Plan Note (Signed)
BP mildly elevated today. Will continue with current medications. No changes today. Follow up in 3 months.

## 2023-02-10 NOTE — Progress Notes (Signed)
Subjective:  Patient ID: Marcus Frazier, male    DOB: 07-08-1960  Age: 62 y.o. MRN: [id number]  CC:   Chief Complaint  Patient presents with   blood glucose elvated     Having side effects with metformin, abd pain and loose stool   Hypertension    HPI:  62 year old male with hypertension, GERD, type 2 diabetes, gout, hyperlipidemia presents for follow-up.  Patient BP mildly elevated here today.  He states that his blood pressure is normally well-controlled.  He is unsure of why.  Endorses compliance with HCTZ and losartan.  Type 2 diabetes control has improved.  Most recent A1c 6.5.  However, he is not tolerating metformin.  He is having abdominal pain and diarrhea.  He would like to discuss other medication options today.  Hyperlipidemia not at goal. Will revisit today.   Patient Active Problem List   Diagnosis Date Noted   Type 2 diabetes mellitus without complications (HCC) 02/10/2023   Hyperlipidemia 02/07/2022   GERD (gastroesophageal reflux disease) 07/14/2021   Idiopathic gout 07/14/2021   Hypertension 09/18/2016    Social Hx   Social History   Socioeconomic History   Marital status: Married    Spouse name: Not on file   Number of children: Not on file   Years of education: Not on file   Highest education level: Not on file  Occupational History   Not on file  Tobacco Use   Smoking status: Former    Current packs/day: 1.00    Average packs/day: 1 pack/day for 4.0 years (4.0 ttl pk-yrs)    Types: Cigarettes   Smokeless tobacco: Never  Substance and Sexual Activity   Alcohol use: No   Drug use: No   Sexual activity: Not on file  Other Topics Concern   Not on file  Social History Narrative   Not on file   Social Determinants of Health   Financial Resource Strain: Not on file  Food Insecurity: Not on file  Transportation Needs: Not on file  Physical Activity: Not on file  Stress: Not on file  Social Connections: Not on file    Review of  Systems Per HPI  Objective:  BP (!) 148/95   Pulse 67   Temp 98.2 F (36.8 C)   Ht 5\' 10"  (1.778 m)   Wt 248 lb (112.5 kg)   SpO2 97%   BMI 35.58 kg/m      02/10/2023    9:18 AM 02/10/2023    8:35 AM 02/10/2023    8:34 AM  BP/Weight  Systolic BP 148 145 155  Diastolic BP 95 92 99  Wt. (Lbs)   248  BMI   35.58 kg/m2    Physical Exam Vitals and nursing note reviewed.  Constitutional:      General: He is not in acute distress.    Appearance: Normal appearance.  HENT:     Head: Normocephalic and atraumatic.  Eyes:     General:        Right eye: No discharge.        Left eye: No discharge.     Conjunctiva/sclera: Conjunctivae normal.  Cardiovascular:     Rate and Rhythm: Normal rate and regular rhythm.  Pulmonary:     Effort: Pulmonary effort is normal.     Breath sounds: Normal breath sounds. No wheezing, rhonchi or rales.  Neurological:     Mental Status: He is alert.  Psychiatric:        Mood and Affect:  Mood normal.        Behavior: Behavior normal.     Lab Results  Component Value Date   WBC 7.7 02/09/2023   HGB 16.6 02/09/2023   HCT 50.6 02/09/2023   PLT 302 02/09/2023   GLUCOSE 136 (H) 02/09/2023   CHOL 166 02/09/2023   TRIG 73 02/09/2023   HDL 44 02/09/2023   LDLCALC 108 (H) 02/09/2023   ALT 33 02/09/2023   AST 25 02/09/2023   NA 142 02/09/2023   K 4.1 02/09/2023   CL 102 02/09/2023   CREATININE 1.08 02/09/2023   BUN 14 02/09/2023   CO2 25 02/09/2023   HGBA1C 6.5 (H) 02/09/2023     Assessment & Plan:   Problem List Items Addressed This Visit       Cardiovascular and Mediastinum   Hypertension    BP mildly elevated today. Will continue with current medications. No changes today. Follow up in 3 months.         Endocrine   Type 2 diabetes mellitus without complications (HCC) - Primary    Experiencing side effects from treatment.  Not tolerating metformin.  Stopping metformin.  Starting Ozempic.      Relevant Medications    Semaglutide,0.25 or 0.5MG /DOS, 2 MG/3ML SOPN     Other   Hyperlipidemia    Recommended lipid-lowering therapy.  Patient wants to wait at this time.  Will revisit at next visit.       Meds ordered this encounter  Medications   Semaglutide,0.25 or 0.5MG /DOS, 2 MG/3ML SOPN    Sig: 0.25 mg once weekly for 4 weeks, then increase to 0.5 mg once weekly.    Dispense:  3 mL    Refill:  1    Follow-up:  Return in about 3 months (around 05/11/2023) for Diabetes follow up.  Everlene Other DO [facility]

## 2023-02-10 NOTE — Assessment & Plan Note (Signed)
Recommended lipid-lowering therapy.  Patient wants to wait at this time.  Will revisit at next visit.

## 2023-02-17 ENCOUNTER — Other Ambulatory Visit: Payer: Self-pay | Admitting: Family Medicine

## 2023-02-17 ENCOUNTER — Telehealth: Payer: Self-pay | Admitting: Pharmacist

## 2023-02-17 MED ORDER — TRULICITY 0.75 MG/0.5ML ~~LOC~~ SOAJ: 0.7500 mg | SUBCUTANEOUS | 0 refills | Status: DC

## 2023-02-17 NOTE — Telephone Encounter (Signed)
Tommie Sams, DO     Please inform patient that I sent in Trulicity as Ozempic was not approved.

## 2023-02-17 NOTE — Telephone Encounter (Signed)
Drop down menu of preferred GLP1/meds shows Trulicity is preferred/covered if you can send that in to pharmacy (still may require PA).  They will not cover Ozempic until Trulicity is tried/failed  Did not tolerate metformin (noted) Nicholas Loch (Key: [id number]) Ozempic (0.25 or 0.5 MG/DOSE) 2MG /3ML pen-injectors

## 2023-02-17 NOTE — Telephone Encounter (Signed)
Patient notified and verbalized understanding. 

## 2023-02-18 ENCOUNTER — Telehealth: Payer: Self-pay

## 2023-02-18 NOTE — Telephone Encounter (Signed)
PA-Trulicity send to plan.

## 2023-02-18 NOTE — Telephone Encounter (Signed)
Outcome Approved today by [employer] 2017 CaseId:[id number];Status:Approved;Review Type:Prior Auth;Coverage Start Date:01/19/2023;Coverage End Date:02/18/2024; Authorization Expiration Date: 02/17/2024 Drug Trulicity 0.75MG /0.5ML auto-injectors

## 2023-03-14 ENCOUNTER — Other Ambulatory Visit: Payer: Self-pay | Admitting: Family Medicine

## 2023-04-13 ENCOUNTER — Other Ambulatory Visit: Payer: Self-pay | Admitting: Family Medicine

## 2023-04-14 ENCOUNTER — Other Ambulatory Visit: Payer: Self-pay

## 2023-04-14 ENCOUNTER — Other Ambulatory Visit: Payer: Self-pay | Admitting: Family Medicine

## 2023-04-14 MED ORDER — TRULICITY 0.75 MG/0.5ML ~~LOC~~ SOAJ: 0.7500 mg | SUBCUTANEOUS | 0 refills | Status: DC

## 2023-05-11 ENCOUNTER — Encounter: Payer: Self-pay | Admitting: Family Medicine

## 2023-05-11 ENCOUNTER — Ambulatory Visit: Payer: Commercial Managed Care - PPO | Admitting: Family Medicine

## 2023-05-11 VITALS — BP 128/78 | HR 65 | Temp 97.9°F | Ht 70.0 in | Wt 247.0 lb

## 2023-05-11 DIAGNOSIS — E119 Type 2 diabetes mellitus without complications: Secondary | ICD-10-CM

## 2023-05-11 DIAGNOSIS — I10 Essential (primary) hypertension: Secondary | ICD-10-CM

## 2023-05-11 DIAGNOSIS — M25561 Pain in right knee: Secondary | ICD-10-CM | POA: Diagnosis not present

## 2023-05-11 DIAGNOSIS — Z87828 Personal history of other (healed) physical injury and trauma: Secondary | ICD-10-CM | POA: Diagnosis not present

## 2023-05-11 DIAGNOSIS — E1169 Type 2 diabetes mellitus with other specified complication: Secondary | ICD-10-CM | POA: Diagnosis not present

## 2023-05-11 DIAGNOSIS — Z7985 Long-term (current) use of injectable non-insulin antidiabetic drugs: Secondary | ICD-10-CM

## 2023-05-11 MED ORDER — NAPROXEN 500 MG PO TABS: 500.0000 mg | ORAL_TABLET | Freq: Two times a day (BID) | ORAL | 1 refills | Status: DC

## 2023-05-11 MED ORDER — TRULICITY 0.75 MG/0.5ML ~~LOC~~ SOAJ: 0.7500 mg | SUBCUTANEOUS | 1 refills | Status: DC

## 2023-05-11 NOTE — Assessment & Plan Note (Signed)
Stable.  Continue losartan and HCTZ. ?

## 2023-05-11 NOTE — Patient Instructions (Signed)
 Medications sent in.  Referral placed.  Follow up in 3-6 months.

## 2023-05-11 NOTE — Progress Notes (Signed)
 Subjective:  Patient ID: Marcus Frazier, male    DOB: December 12, 1960  Age: 63 y.o. MRN: [id number]  CC:   Chief Complaint  Patient presents with   Diabetes    Diabetes follow up     HPI:  63 year old male presents for follow-up.  Patient's most recent A1c 6.5.  He is doing well on Trulicity.  No reported adverse side effects.  Needs refill.  Needs foot exam today.  Patient reports that he has had ongoing right knee pain.  He states that he has had prior meniscal tear.  He states that his pain seems to respond to naproxen.  Hypertension stable on losartan and HCTZ.  Patient Active Problem List   Diagnosis Date Noted   Right knee pain 05/11/2023   Type 2 diabetes mellitus without complications (HCC) 02/10/2023   Hyperlipidemia 02/07/2022   GERD (gastroesophageal reflux disease) 07/14/2021   Idiopathic gout 07/14/2021   Hypertension 09/18/2016    Social Hx   Social History   Socioeconomic History   Marital status: Married    Spouse name: Not on file   Number of children: Not on file   Years of education: Not on file   Highest education level: Not on file  Occupational History   Not on file  Tobacco Use   Smoking status: Former    Current packs/day: 1.00    Average packs/day: 1 pack/day for 4.0 years (4.0 ttl pk-yrs)    Types: Cigarettes   Smokeless tobacco: Never  Substance and Sexual Activity   Alcohol use: No   Drug use: No   Sexual activity: Not on file  Other Topics Concern   Not on file  Social History Narrative   Not on file   Social Drivers of Health   Financial Resource Strain: Not on file  Food Insecurity: Not on file  Transportation Needs: Not on file  Physical Activity: Not on file  Stress: Not on file  Social Connections: Not on file    Review of Systems Per HPI  Objective:  BP 128/78   Pulse 65   Temp 97.9 F (36.6 C)   Ht 5\' 10"  (1.778 m)   Wt 247 lb (112 kg)   SpO2 98%   BMI 35.44 kg/m      05/11/2023    8:57 AM 02/10/2023     9:18 AM 02/10/2023    8:35 AM  BP/Weight  Systolic BP 128 148 145  Diastolic BP 78 95 92  Wt. (Lbs) 247    BMI 35.44 kg/m2      Physical Exam Vitals and nursing note reviewed.  Constitutional:      General: He is not in acute distress.    Appearance: Normal appearance. He is obese.  HENT:     Head: Normocephalic and atraumatic.  Cardiovascular:     Rate and Rhythm: Normal rate and regular rhythm.  Pulmonary:     Effort: Pulmonary effort is normal.     Breath sounds: Normal breath sounds. No wheezing or rales.  Musculoskeletal:     Comments: Right knee: Joint line tenderness.  No appreciable effusion.  Feet:     Comments: Diabetic foot exam performed today.  See quality metrics section. Neurological:     Mental Status: He is alert.     Lab Results  Component Value Date   WBC 7.7 02/09/2023   HGB 16.6 02/09/2023   HCT 50.6 02/09/2023   PLT 302 02/09/2023   GLUCOSE 136 (H) 02/09/2023   CHOL  166 02/09/2023   TRIG 73 02/09/2023   HDL 44 02/09/2023   LDLCALC 108 (H) 02/09/2023   ALT 33 02/09/2023   AST 25 02/09/2023   NA 142 02/09/2023   K 4.1 02/09/2023   CL 102 02/09/2023   CREATININE 1.08 02/09/2023   BUN 14 02/09/2023   CO2 25 02/09/2023   HGBA1C 6.5 (H) 02/09/2023     Assessment & Plan:  Type 2 diabetes mellitus without complication, without long-term current use of insulin (HCC) Assessment & Plan: Stable.  Trulicity refilled.  Orders: -     Trulicity; Inject 0.75 mg into the muscle once a week.  Dispense: 4 mL; Refill: 1  Right knee pain, unspecified chronicity Assessment & Plan: Referring to orthopedics given prior meniscal tear.  Naproxen as directed.  Orders: -     Ambulatory referral to Orthopedic Surgery -     Naproxen; Take 1 tablet (500 mg total) by mouth 2 (two) times daily with a meal.  Dispense: 60 tablet; Refill: 1  History of meniscal tear -     Ambulatory referral to Orthopedic Surgery  Primary hypertension Assessment &  Plan: Stable.  Continue losartan and HCTZ.     Follow-up: 3 to 6 months  Goodwin Kamphaus Adriana Simas DO [facility]

## 2023-05-11 NOTE — Assessment & Plan Note (Signed)
 Stable.  Trulicity refilled.

## 2023-05-11 NOTE — Assessment & Plan Note (Signed)
 Referring to orthopedics given prior meniscal tear.  Naproxen as directed.

## 2023-07-07 ENCOUNTER — Other Ambulatory Visit: Payer: Self-pay | Admitting: Family Medicine

## 2023-07-07 DIAGNOSIS — E119 Type 2 diabetes mellitus without complications: Secondary | ICD-10-CM

## 2023-08-05 ENCOUNTER — Other Ambulatory Visit: Payer: Self-pay | Admitting: Family Medicine

## 2023-08-05 DIAGNOSIS — E119 Type 2 diabetes mellitus without complications: Secondary | ICD-10-CM

## 2023-09-14 ENCOUNTER — Other Ambulatory Visit: Payer: Self-pay | Admitting: Family Medicine

## 2023-09-14 DIAGNOSIS — E119 Type 2 diabetes mellitus without complications: Secondary | ICD-10-CM

## 2023-10-26 LAB — HM DIABETES EYE EXAM

## 2023-11-11 ENCOUNTER — Encounter: Payer: Self-pay | Admitting: Family Medicine

## 2023-11-11 ENCOUNTER — Ambulatory Visit: Admitting: Family Medicine

## 2023-11-11 VITALS — BP 134/86 | HR 59 | Ht 70.0 in | Wt 239.0 lb

## 2023-11-11 DIAGNOSIS — I10 Essential (primary) hypertension: Secondary | ICD-10-CM | POA: Diagnosis not present

## 2023-11-11 DIAGNOSIS — M7918 Myalgia, other site: Secondary | ICD-10-CM

## 2023-11-11 DIAGNOSIS — E119 Type 2 diabetes mellitus without complications: Secondary | ICD-10-CM | POA: Diagnosis not present

## 2023-11-11 DIAGNOSIS — Z125 Encounter for screening for malignant neoplasm of prostate: Secondary | ICD-10-CM

## 2023-11-11 DIAGNOSIS — K219 Gastro-esophageal reflux disease without esophagitis: Secondary | ICD-10-CM

## 2023-11-11 DIAGNOSIS — E7800 Pure hypercholesterolemia, unspecified: Secondary | ICD-10-CM

## 2023-11-11 DIAGNOSIS — Z130 Encounter for screening for diseases of the blood and blood-forming organs and certain disorders involving the immune mechanism: Secondary | ICD-10-CM

## 2023-11-11 DIAGNOSIS — M1000 Idiopathic gout, unspecified site: Secondary | ICD-10-CM

## 2023-11-11 MED ORDER — NAPROXEN 500 MG PO TABS
500.0000 mg | ORAL_TABLET | Freq: Two times a day (BID) | ORAL | 1 refills | Status: AC | PRN
Start: 2023-11-11 — End: ?

## 2023-11-11 MED ORDER — ALLOPURINOL 300 MG PO TABS
300.0000 mg | ORAL_TABLET | Freq: Every day | ORAL | 3 refills | Status: AC
Start: 2023-11-11 — End: ?

## 2023-11-11 MED ORDER — TRULICITY 0.75 MG/0.5ML ~~LOC~~ SOAJ: SUBCUTANEOUS | 3 refills | Status: AC

## 2023-11-11 MED ORDER — LOSARTAN POTASSIUM 100 MG PO TABS: 100.0000 mg | ORAL_TABLET | Freq: Every day | ORAL | 3 refills | Status: AC

## 2023-11-11 MED ORDER — PANTOPRAZOLE SODIUM 40 MG PO TBEC: DELAYED_RELEASE_TABLET | ORAL | 3 refills | Status: AC

## 2023-11-11 MED ORDER — HYDROCHLOROTHIAZIDE 25 MG PO TABS: 25.0000 mg | ORAL_TABLET | Freq: Every day | ORAL | 3 refills | Status: AC

## 2023-11-11 NOTE — Assessment & Plan Note (Signed)
 Naproxen  as directed.

## 2023-11-11 NOTE — Assessment & Plan Note (Signed)
 Stable.  Continue current medications.

## 2023-11-11 NOTE — Assessment & Plan Note (Signed)
 Lipid panel to assess.

## 2023-11-11 NOTE — Assessment & Plan Note (Signed)
 Has been stable.  A1c today.  Continue Trulicity .

## 2023-11-11 NOTE — Patient Instructions (Signed)
Labs today.  Medications refilled.  Follow up in 6 months. 

## 2023-11-11 NOTE — Progress Notes (Signed)
 Subjective:  Patient ID: Marcus Frazier, male    DOB: [birth year]  Age: 63 y.o. MRN: [id number]  CC:   Chief Complaint  Patient presents with   Medical Management of Chronic Issues    Declines flu shot today    HPI:  63 year old male presents for follow-up.  Patient reports that overall he is doing well.  He does note that he is having some pain to the left trap.  He is requesting refill on naproxen .  Blood pressure stable.  He is compliant with losartan  and HCTZ.  A1c has been at goal.  Last A1c 6.5.  Needs A1c today.  Compliant with Trulicity .  Patient needs lipid panel.  Has declined lipid-lowering therapy in the past.  Reassessing today.  Patient declines pneumococcal vaccine and flu vaccine today.  Patient Active Problem List   Diagnosis Date Noted   Musculoskeletal pain 11/11/2023   Right knee pain 05/11/2023   Type 2 diabetes mellitus without complications (HCC) 02/10/2023   Hyperlipidemia 02/07/2022   GERD (gastroesophageal reflux disease) 07/14/2021   Idiopathic gout 07/14/2021   Hypertension 09/18/2016    Social Hx   Social History   Socioeconomic History   Marital status: Married    Spouse name: Not on file   Number of children: Not on file   Years of education: Not on file   Highest education level: Not on file  Occupational History   Not on file  Tobacco Use   Smoking status: Former    Current packs/day: 1.00    Average packs/day: 1 pack/day for 4.0 years (4.0 ttl pk-yrs)    Types: Cigarettes   Smokeless tobacco: Never  Substance and Sexual Activity   Alcohol use: No   Drug use: No   Sexual activity: Not on file  Other Topics Concern   Not on file  Social History Narrative   Not on file   Social Drivers of Health   Financial Resource Strain: Not on file  Food Insecurity: Not on file  Transportation Needs: Not on file  Physical Activity: Not on file  Stress: Not on file  Social Connections: Not on file    Review of Systems Per  HPI  Objective:  BP 134/86   Pulse (!) 59   Ht 5' 10 (1.778 m)   Wt 239 lb (108.4 kg)   SpO2 97%   BMI 34.29 kg/m      11/11/2023    9:00 AM 05/11/2023    8:57 AM 02/10/2023    9:18 AM  BP/Weight  Systolic BP 134 128 148  Diastolic BP 86 78 95  Wt. (Lbs) 239 247   BMI 34.29 kg/m2 35.44 kg/m2     Physical Exam Vitals and nursing note reviewed.  Constitutional:      General: He is not in acute distress.    Appearance: Normal appearance.  HENT:     Head: Normocephalic and atraumatic.  Eyes:     General:        Right eye: No discharge.        Left eye: No discharge.     Conjunctiva/sclera: Conjunctivae normal.  Cardiovascular:     Rate and Rhythm: Normal rate and regular rhythm.  Pulmonary:     Effort: Pulmonary effort is normal.     Breath sounds: Normal breath sounds. No wheezing, rhonchi or rales.  Musculoskeletal:     Comments: Tenderness over the superior aspect of the left trapezius.  Neurological:     Mental Status: He  is alert.  Psychiatric:        Mood and Affect: Mood normal.        Behavior: Behavior normal.     Lab Results  Component Value Date   WBC 7.7 02/09/2023   HGB 16.6 02/09/2023   HCT 50.6 02/09/2023   PLT 302 02/09/2023   GLUCOSE 136 (H) 02/09/2023   CHOL 166 02/09/2023   TRIG 73 02/09/2023   HDL 44 02/09/2023   LDLCALC 108 (H) 02/09/2023   ALT 33 02/09/2023   AST 25 02/09/2023   NA 142 02/09/2023   K 4.1 02/09/2023   CL 102 02/09/2023   CREATININE 1.08 02/09/2023   BUN 14 02/09/2023   CO2 25 02/09/2023   HGBA1C 6.5 (H) 02/09/2023     Assessment & Plan:  Primary hypertension Assessment & Plan: Stable.  Continue current medications.  Orders: -     hydroCHLOROthiazide ; Take 1 tablet (25 mg total) by mouth daily.  Dispense: 90 tablet; Refill: 3 -     Losartan  Potassium; Take 1 tablet (100 mg total) by mouth daily.  Dispense: 90 tablet; Refill: 3  Type 2 diabetes mellitus without complication, without long-term current use of  insulin (HCC) Assessment & Plan: Has been stable.  A1c today.  Continue Trulicity .  Orders: -     Trulicity ; INJECT 1 SYRINGE SUBCUTANEOUSLY ONCE A WEEK  Dispense: 12 mL; Refill: 3 -     CMP14+EGFR -     Hemoglobin A1c -     Microalbumin / creatinine urine ratio  Gastroesophageal reflux disease without esophagitis -     Pantoprazole  Sodium; Take 1 tablet by daily  Dispense: 90 tablet; Refill: 3  Idiopathic gout, unspecified chronicity, unspecified site -     Allopurinol ; Take 1 tablet (300 mg total) by mouth daily.  Dispense: 90 tablet; Refill: 3  Pure hypercholesterolemia Assessment & Plan: Lipid panel to assess.  Orders: -     Lipid panel  Screening for deficiency anemia -     CBC  Screening PSA (prostate specific antigen) -     PSA  Musculoskeletal pain Assessment & Plan: Naproxen  as directed.  Orders: -     Naproxen ; Take 1 tablet (500 mg total) by mouth 2 (two) times daily as needed for moderate pain (pain score 4-6).  Dispense: 60 tablet; Refill: 1    Follow-up:  6 months  Tarius Stangelo Bluford DO [facility]

## 2023-11-12 ENCOUNTER — Ambulatory Visit: Payer: Self-pay | Admitting: Family Medicine

## 2023-11-12 LAB — LIPID PANEL
Chol/HDL Ratio: 3.6 ratio (ref 0.0–5.0)
Cholesterol, Total: 149 mg/dL (ref 100–199)
HDL: 41 mg/dL (ref >39)
LDL Chol Calc (NIH): 95 mg/dL (ref 0–99)
Triglycerides: 67 mg/dL (ref 0–149)
VLDL Cholesterol Cal: 13 mg/dL (ref 5–40)

## 2023-11-12 LAB — CMP14+EGFR
ALT: 24 IU/L (ref 0–44)
AST: 24 IU/L (ref 0–40)
Albumin: 4.6 g/dL (ref 3.9–4.9)
Alkaline Phosphatase: 95 IU/L (ref 44–121)
BUN/Creatinine Ratio: 12 (ref 10–24)
BUN: 12 mg/dL (ref 8–27)
Bilirubin Total: 1.6 mg/dL — ABNORMAL HIGH (ref 0.0–1.2)
CO2: 22 mmol/L (ref 20–29)
Calcium: 9.8 mg/dL (ref 8.6–10.2)
Chloride: 100 mmol/L (ref 96–106)
Creatinine, Ser: 0.99 mg/dL (ref 0.76–1.27)
Globulin, Total: 2.6 g/dL (ref 1.5–4.5)
Glucose: 103 mg/dL — ABNORMAL HIGH (ref 70–99)
Potassium: 4.3 mmol/L (ref 3.5–5.2)
Sodium: 138 mmol/L (ref 134–144)
Total Protein: 7.2 g/dL (ref 6.0–8.5)
eGFR: 86 mL/min/1.73 (ref >59)

## 2023-11-12 LAB — CBC
Hematocrit: 50.9 % (ref 37.5–51.0)
Hemoglobin: 16.8 g/dL (ref 13.0–17.7)
MCH: 29.8 pg (ref 26.6–33.0)
MCHC: 33 g/dL (ref 31.5–35.7)
MCV: 90 fL (ref 79–97)
Platelets: 276 x10E3/uL (ref 150–450)
RBC: 5.63 x10E6/uL (ref 4.14–5.80)
RDW: 14.1 % (ref 11.6–15.4)
WBC: 8.2 x10E3/uL (ref 3.4–10.8)

## 2023-11-12 LAB — HEMOGLOBIN A1C
Est. average glucose Bld gHb Est-mCnc: 123 mg/dL
Hgb A1c MFr Bld: 5.9 % — ABNORMAL HIGH (ref 4.8–5.6)

## 2023-11-12 LAB — PSA: Prostate Specific Ag, Serum: 0.5 ng/mL (ref 0.0–4.0)

## 2023-11-12 LAB — MICROALBUMIN / CREATININE URINE RATIO
Creatinine, Urine: 169.5 mg/dL
Microalb/Creat Ratio: 3 mg/g{creat} (ref 0–29)
Microalbumin, Urine: 4.7 ug/mL

## 2024-02-24 ENCOUNTER — Other Ambulatory Visit (HOSPITAL_COMMUNITY): Payer: Self-pay

## 2024-02-24 ENCOUNTER — Telehealth: Payer: Self-pay | Admitting: Pharmacy Technician

## 2024-02-24 NOTE — Telephone Encounter (Signed)
 Pharmacy Patient Advocate Encounter   Received notification from Onbase that prior authorization for Trulicity  0.75MG /0.5ML auto-injectors is required/requested.   Insurance verification completed.   The patient is insured through [employer].   Per test claim: PA required; PA submitted to above mentioned insurance via Latent Key/confirmation #/EOC [id number] Status is pending

## 2024-02-28 ENCOUNTER — Encounter: Payer: Self-pay | Admitting: Family Medicine

## 2024-02-29 ENCOUNTER — Other Ambulatory Visit (HOSPITAL_COMMUNITY): Payer: Self-pay

## 2024-02-29 NOTE — Telephone Encounter (Signed)
 Pharmacy Patient Advocate Encounter  Received notification from [employer] that Prior Authorization for Trulicity  0.75MG /0.5ML auto-injectors has been APPROVED from 01/25/2024 to 02/28/2025. Unable to obtain price due to refill too soon rejection, last fill date 02/29/2024 next available fill date02/22/2026.   PA #/Case ID/Reference #: [id number]

## 2024-03-01 ENCOUNTER — Telehealth: Payer: Self-pay | Admitting: Pharmacist

## 2024-03-01 NOTE — Telephone Encounter (Signed)
 Was contacted to review PA for Trulicity  [facility] filled on 12/22 (today) for 84 day supply PA team got approved through 2026 My chart message sent to patient for follow up  Mliss Tarry Griffin, PharmD, BCACP, CPP Clinical Pharmacist, [facility]

## 2024-05-10 ENCOUNTER — Ambulatory Visit: Admitting: Family Medicine
